# Patient Record
Sex: Female | Born: 1950
Health system: Southern US, Community
[De-identification: ages and names within clinical notes are randomized; demographics above are authoritative.]

## PROBLEM LIST (undated history)

## (undated) DIAGNOSIS — T7840XA Allergy, unspecified, initial encounter: Secondary | ICD-10-CM

## (undated) DIAGNOSIS — I2510 Atherosclerotic heart disease of native coronary artery without angina pectoris: Secondary | ICD-10-CM

## (undated) DIAGNOSIS — I10 Essential (primary) hypertension: Secondary | ICD-10-CM

## (undated) DIAGNOSIS — K219 Gastro-esophageal reflux disease without esophagitis: Secondary | ICD-10-CM

## (undated) DIAGNOSIS — E785 Hyperlipidemia, unspecified: Secondary | ICD-10-CM

## (undated) HISTORY — DX: Hyperlipidemia, unspecified: E78.5

## (undated) HISTORY — DX: Essential (primary) hypertension: I10

## (undated) HISTORY — DX: Gastro-esophageal reflux disease without esophagitis: K21.9

## (undated) HISTORY — PX: CHOLECYSTECTOMY: SHX55

## (undated) HISTORY — DX: Atherosclerotic heart disease of native coronary artery without angina pectoris: I25.10

## (undated) HISTORY — DX: Allergy, unspecified, initial encounter: T78.40XA

---

## 1991-10-18 HISTORY — PX: ABDOMINAL HYSTERECTOMY: SHX81

## 1991-10-18 HISTORY — PX: CHOLECYSTECTOMY, LAPAROSCOPIC: SHX56

## 1992-10-17 HISTORY — PX: BREAST BIOPSY: SHX20

## 1999-10-12 ENCOUNTER — Encounter: Payer: Self-pay | Admitting: Obstetrics and Gynecology

## 1999-10-12 ENCOUNTER — Encounter: Admission: RE | Admit: 1999-10-12 | Discharge: 1999-10-12 | Payer: Self-pay | Admitting: Obstetrics and Gynecology

## 2000-10-13 ENCOUNTER — Encounter: Admission: RE | Admit: 2000-10-13 | Discharge: 2000-10-13 | Payer: Self-pay | Admitting: Family Medicine

## 2000-10-13 ENCOUNTER — Encounter: Payer: Self-pay | Admitting: Family Medicine

## 2001-10-15 ENCOUNTER — Encounter: Payer: Self-pay | Admitting: Obstetrics and Gynecology

## 2001-10-15 ENCOUNTER — Encounter: Admission: RE | Admit: 2001-10-15 | Discharge: 2001-10-15 | Payer: Self-pay | Admitting: Obstetrics and Gynecology

## 2002-10-16 ENCOUNTER — Encounter: Payer: Self-pay | Admitting: Family Medicine

## 2002-10-16 ENCOUNTER — Encounter: Admission: RE | Admit: 2002-10-16 | Discharge: 2002-10-16 | Payer: Self-pay | Admitting: Family Medicine

## 2002-10-22 ENCOUNTER — Encounter: Payer: Self-pay | Admitting: Family Medicine

## 2002-10-22 ENCOUNTER — Encounter: Admission: RE | Admit: 2002-10-22 | Discharge: 2002-10-22 | Payer: Self-pay | Admitting: Family Medicine

## 2004-02-18 ENCOUNTER — Ambulatory Visit (HOSPITAL_COMMUNITY): Admission: RE | Admit: 2004-02-18 | Discharge: 2004-02-18 | Payer: Self-pay | Admitting: Obstetrics and Gynecology

## 2017-08-29 DIAGNOSIS — Z23 Encounter for immunization: Secondary | ICD-10-CM | POA: Diagnosis not present

## 2017-08-29 DIAGNOSIS — M545 Low back pain: Secondary | ICD-10-CM | POA: Diagnosis not present

## 2017-09-11 DIAGNOSIS — R739 Hyperglycemia, unspecified: Secondary | ICD-10-CM | POA: Diagnosis not present

## 2017-09-11 DIAGNOSIS — Z0000 Encounter for general adult medical examination without abnormal findings: Secondary | ICD-10-CM | POA: Diagnosis not present

## 2017-09-11 DIAGNOSIS — K219 Gastro-esophageal reflux disease without esophagitis: Secondary | ICD-10-CM | POA: Diagnosis not present

## 2017-09-12 DIAGNOSIS — R739 Hyperglycemia, unspecified: Secondary | ICD-10-CM | POA: Diagnosis not present

## 2017-09-12 DIAGNOSIS — J309 Allergic rhinitis, unspecified: Secondary | ICD-10-CM | POA: Diagnosis not present

## 2017-09-12 DIAGNOSIS — R94120 Abnormal auditory function study: Secondary | ICD-10-CM | POA: Diagnosis not present

## 2017-09-12 DIAGNOSIS — I10 Essential (primary) hypertension: Secondary | ICD-10-CM | POA: Diagnosis not present

## 2017-09-12 DIAGNOSIS — M7062 Trochanteric bursitis, left hip: Secondary | ICD-10-CM | POA: Diagnosis not present

## 2017-09-12 DIAGNOSIS — K210 Gastro-esophageal reflux disease with esophagitis: Secondary | ICD-10-CM | POA: Diagnosis not present

## 2017-09-12 DIAGNOSIS — Z0000 Encounter for general adult medical examination without abnormal findings: Secondary | ICD-10-CM | POA: Diagnosis not present

## 2017-09-19 DIAGNOSIS — H9072 Mixed conductive and sensorineural hearing loss, unilateral, left ear, with unrestricted hearing on the contralateral side: Secondary | ICD-10-CM | POA: Diagnosis not present

## 2017-10-26 DIAGNOSIS — H903 Sensorineural hearing loss, bilateral: Secondary | ICD-10-CM | POA: Diagnosis not present

## 2017-10-26 DIAGNOSIS — H608X9 Other otitis externa, unspecified ear: Secondary | ICD-10-CM | POA: Diagnosis not present

## 2017-10-26 DIAGNOSIS — H838X3 Other specified diseases of inner ear, bilateral: Secondary | ICD-10-CM | POA: Diagnosis not present

## 2017-10-26 DIAGNOSIS — H908 Mixed conductive and sensorineural hearing loss, unspecified: Secondary | ICD-10-CM | POA: Diagnosis not present

## 2017-10-26 DIAGNOSIS — H8092 Unspecified otosclerosis, left ear: Secondary | ICD-10-CM | POA: Diagnosis not present

## 2017-11-20 DIAGNOSIS — J40 Bronchitis, not specified as acute or chronic: Secondary | ICD-10-CM | POA: Diagnosis not present

## 2017-11-20 DIAGNOSIS — J069 Acute upper respiratory infection, unspecified: Secondary | ICD-10-CM | POA: Diagnosis not present

## 2018-02-14 DIAGNOSIS — Z1231 Encounter for screening mammogram for malignant neoplasm of breast: Secondary | ICD-10-CM | POA: Diagnosis not present

## 2018-03-13 DIAGNOSIS — R739 Hyperglycemia, unspecified: Secondary | ICD-10-CM | POA: Diagnosis not present

## 2018-03-13 DIAGNOSIS — I10 Essential (primary) hypertension: Secondary | ICD-10-CM | POA: Diagnosis not present

## 2018-03-13 DIAGNOSIS — E782 Mixed hyperlipidemia: Secondary | ICD-10-CM | POA: Diagnosis not present

## 2018-03-14 DIAGNOSIS — E785 Hyperlipidemia, unspecified: Secondary | ICD-10-CM | POA: Diagnosis not present

## 2018-03-14 DIAGNOSIS — R739 Hyperglycemia, unspecified: Secondary | ICD-10-CM | POA: Diagnosis not present

## 2018-03-20 DIAGNOSIS — Z1211 Encounter for screening for malignant neoplasm of colon: Secondary | ICD-10-CM | POA: Diagnosis not present

## 2018-03-20 DIAGNOSIS — K648 Other hemorrhoids: Secondary | ICD-10-CM | POA: Diagnosis not present

## 2018-03-20 DIAGNOSIS — Z8371 Family history of colonic polyps: Secondary | ICD-10-CM | POA: Diagnosis not present

## 2018-06-07 DIAGNOSIS — E782 Mixed hyperlipidemia: Secondary | ICD-10-CM | POA: Diagnosis not present

## 2018-06-19 DIAGNOSIS — E782 Mixed hyperlipidemia: Secondary | ICD-10-CM | POA: Diagnosis not present

## 2018-06-19 DIAGNOSIS — Z23 Encounter for immunization: Secondary | ICD-10-CM | POA: Diagnosis not present

## 2018-07-24 ENCOUNTER — Telehealth: Payer: Self-pay

## 2018-07-24 NOTE — Telephone Encounter (Signed)
My practice is too full unfortunately we can get her in with someone else in practice if she is willing and we can always revisit later if needed.

## 2018-07-24 NOTE — Telephone Encounter (Signed)
Copied from [facility] [phone]. Topic: Quick Communication - See Telephone Encounter >> Jul 24, 2018 11:25 AM Hewitt Shorts wrote: Pt is wanting to see if Dr. Charlett Blake will take her own as a new ppatient  Best number [phone]

## 2018-07-26 DIAGNOSIS — Z83511 Family history of glaucoma: Secondary | ICD-10-CM | POA: Diagnosis not present

## 2018-07-26 DIAGNOSIS — H40013 Open angle with borderline findings, low risk, bilateral: Secondary | ICD-10-CM | POA: Diagnosis not present

## 2018-07-26 DIAGNOSIS — H40053 Ocular hypertension, bilateral: Secondary | ICD-10-CM | POA: Diagnosis not present

## 2018-07-27 NOTE — Telephone Encounter (Signed)
Called pt on 07/25/2018 and asked her if I could call her back due to patients coming in office. Called her on 07/26/2018 and 07/27/2018. Left voicemail on [phone] for pt to call us back to set up appt with Dr. Nani Ravens. She states she would rather have a DO than a PA.

## 2018-08-20 ENCOUNTER — Encounter: Payer: Self-pay | Admitting: Family Medicine

## 2018-08-20 ENCOUNTER — Ambulatory Visit (INDEPENDENT_AMBULATORY_CARE_PROVIDER_SITE_OTHER): Payer: Medicare Other | Admitting: Family Medicine

## 2018-08-20 VITALS — BP 130/90 | HR 81 | Temp 98.0°F | Ht 62.0 in | Wt 142.2 lb

## 2018-08-20 DIAGNOSIS — J069 Acute upper respiratory infection, unspecified: Secondary | ICD-10-CM | POA: Diagnosis not present

## 2018-08-20 DIAGNOSIS — M7062 Trochanteric bursitis, left hip: Secondary | ICD-10-CM

## 2018-08-20 MED ORDER — CELECOXIB 200 MG PO CAPS: 200.0000 mg | ORAL_CAPSULE | Freq: Every day | ORAL | 2 refills | Status: DC | PRN

## 2018-08-20 NOTE — Patient Instructions (Signed)
Continue to push fluids, practice good hand hygiene, and cover your mouth if you cough.  If you start having fevers, shaking or shortness of breath, seek immediate care.  Stretches/exercises It is normal to feel mild stretching, pulling, tightness, or discomfort as you do these exercises, but you should stop right away if you feel sudden pain or your pain gets worse.  Stretching and range of motion exercises These exercises warm up your muscles and joints and improve the movement and flexibility of your hip and pelvis. Exercise A: Quadriceps, prone    1. Lie on your abdomen on a firm surface, such as a bed or padded floor. 2. Bend your left / right knee and hold your ankle. If you cannot reach your ankle or pant leg, loop a belt around your foot and grab the belt instead. 3. Gently pull your heel toward your buttocks. Your knee should not slide out to the side. You should feel a stretch in the front of your thigh and knee. 4. Hold this position for 30 seconds. Repeat 2 times. Complete this stretch 3 times per week. Exercise B: Iliotibial band    1. Lie on your side with your left / right leg in the top position. 2. Bend both of your knees and grab your left / right ankle. Stretch out your bottom arm to help you balance. 3. Slowly bring your top knee back so your thigh goes behind your trunk. 4. Slowly lower your top leg toward the floor until you feel a gentle stretch on the outside of your left / right hip and thigh. If you do not feel a stretch and your knee will not fall farther, place the heel of your other foot on top of your knee and pull your knee down toward the floor with your foot. 5. Hold this position for 30 seconds. Repeat 2 times. Complete this stretch 3 times per week. Strengthening exercises These exercises build strength and endurance in your hip and pelvis. Endurance is the ability to use your muscles for a long time, even after they get tired. Exercise C: Straight leg  raises (hip abductors)     1. Lie on your side with your left / right leg in the top position. Lie so your head, shoulder, knee, and hip line up. You may bend your bottom knee to help you balance. 2. Roll your hips slightly forward so your hips are stacked directly over each other and your left / right knee is facing forward. 3. Tense the muscles in your outer thigh and lift your top leg 4-6 inches (10-15 cm). 4. Hold this position for 3 seconds. Repeat for a total of 10 reps. 5. Slowly return to the starting position. Let your muscles relax completely before doing another repetition. Repeat 2 times. Complete this exercise 3 times per week. Exercise D: Straight leg raises (hip extensors) 1. Lie on your abdomen on your bed or a firm surface. You can put a pillow under your hips if that is more comfortable. 2. Bend your left / right knee so your foot is straight up in the air. 3. Squeeze your buttock muscles and lift your left / right thigh off the bed. Do not let your back arch. 4. Tense this muscle as hard as you can without increasing any knee pain. 5. Hold this position for 2 seconds. Repeat for a total of 10 reps 6. Slowly lower your leg to the starting position and allow it to relax completely. Repeat 2  times. Complete this exercise 3 times per week. Exercise E: Hip hike 1. Stand sideways on a bottom step. Stand on your left / right leg with your other foot unsupported next to the step. You can hold onto the railing or wall if needed for balance. 2. Keep your knees straight and your torso square. Then, lift your left / right hip up toward the ceiling. 3. Slowly let your left / right hip lower toward the floor, past the starting position. Your foot should get closer to the floor. Do not lean or bend your knees. Repeat 2 times. Complete this exercise 3 times per week.  Document Released: 10/03/2005 Document Revised: 06/07/2016 Document Reviewed: 09/04/2015 Elsevier Interactive Patient  Education  [employer].

## 2018-08-20 NOTE — Progress Notes (Signed)
Chief Complaint  Patient presents with  . New Patient (Initial Visit)       New Patient Visit SUBJECTIVE: HPI: Kelsey Stanley is an 67 y.o.female who is being seen for establishing care.   The patient was previously seen at [facility].  Duration: 1 week  Associated symptoms: sinus congestion, rhinorrhea and upper dental pain Denies: itchy watery eyes, ear pain, sinus, ear drainage, sore throat, wheezing, shortness of breath, myalgia and fevers Treatment to date: OTC cold medicine, throat lozenges, Tylenol  50% better today Sick contacts: No  Patient has a history of left-sided greater trochanteric bursitis.  She will take Celebrex intermittently for this issue and it usually helps.  She has never had an injection for this area because it is never bothered her enough to do so.  She has never seen physical therapy or had any home stretches or exercises for her IT band.  It is not bothering her significantly right now.  No numbness, tingling, or weakness.  Allergies  Allergen Reactions  . Morphine And Related Other (See Comments)    Unable to communicate  . Amoxicillin Rash  . Erythromycin Rash    Past Medical History:  Diagnosis Date  . GERD (gastroesophageal reflux disease)   . Hyperlipidemia   . Hypertension    Past Surgical History:  Procedure Laterality Date  . ABDOMINAL HYSTERECTOMY  1993  . BREAST BIOPSY Right 1994  . CHOLECYSTECTOMY, LAPAROSCOPIC  1993   Family History  Problem Relation Age of Onset  . Diabetes Mother   . Hyperlipidemia Mother   . Hypertension Mother   . Glaucoma Mother   . Cancer Mother        bladder cancer  . Heart disease Father   . Diabetes Sister   . Hypertension Sister   . Hyperlipidemia Brother   . Hypertension Brother   . Diabetes Sister   . Hypertension Sister    Allergies  Allergen Reactions  . Morphine And Related Other (See Comments)    Unable to communicate  . Amoxicillin Rash  . Erythromycin Rash    Current  Outpatient Medications:  .  celecoxib (CELEBREX) 200 MG capsule, Take 1 capsule (200 mg total) by mouth daily as needed for moderate pain., Disp: 30 capsule, Rfl: 2 .  cetirizine (ZYRTEC) 10 MG tablet, Take 10 mg by mouth daily., Disp: , Rfl:  .  cholecalciferol (VITAMIN D) 400 units TABS tablet, Take 400 Units by mouth., Disp: , Rfl:  .  Glucosamine-MSM-Hyaluronic Acd (JOINT HEALTH PO), Take by mouth., Disp: , Rfl:  .  metoprolol tartrate (LOPRESSOR) 50 MG tablet, Take 50 mg by mouth 2 (two) times daily., Disp: , Rfl:  .  pantoprazole (PROTONIX) 20 MG tablet, Take 20 mg by mouth daily., Disp: , Rfl:  .  pravastatin (PRAVACHOL) 40 MG tablet, Take 40 mg by mouth daily., Disp: , Rfl:  .  Probiotic Product (PROBIOTIC-10 PO), Take by mouth., Disp: , Rfl:   ROS Musculoskeletal: Denies current hip pain  Respiratory: Denies dyspnea   OBJECTIVE: BP 130/90 (BP Location: Left Arm, Patient Position: Sitting, Cuff Size: Normal)   Pulse 81   Temp 98 F (36.7 C) (Oral)   Ht 5\' 2"  (1.575 m)   Wt 142 lb 4 oz (64.5 kg)   SpO2 95%   BMI 26.02 kg/m   Constitutional: -  VS reviewed -  Well developed, well nourished, appears stated age -  No apparent distress  Psychiatric: -  Oriented to person, place, and time -  Memory intact -  Affect and mood normal -  Fluent conversation, good eye contact -  Judgment and insight age appropriate  Eye: -  Conjunctivae clear, no discharge -  Pupils symmetric, round, reactive to light  ENMT: -  Ears neg -  Nares patent, no d/c -  No sinus ttp -  MMM    Pharynx moist, no exudate, no erythema  Neck: -  No gross swelling, no palpable masses -  Thyroid midline, not enlarged, mobile, no palpable masses  Cardiovascular: -  RRR -  No LE edema  Respiratory: -  Normal respiratory effort, no accessory muscle use, no retraction -  Breath sounds equal, no wheezes, no ronchi, no crackles  Musculoskeletal: -  No clubbing, no cyanosis -  Gait normal -  +TTP over greater  trochanteric bursa on the left  Skin: -  No significant lesion on inspection -  Warm and dry to palpation   ASSESSMENT/PLAN: Upper respiratory tract infection, unspecified type  Greater trochanteric bursitis of left hip  I am able to see her records through care everywhere. Supportive care for respiratory infection.  This sounds like it is improving. I will continue Celebrex, intermittent use, for her bursitis.  I also gave her some stretches/exercises for her IT band.  She will let me know if it is bothersome enough to get an injection. Patient should return in 6 weeks for her annual wellness visit and physical. The patient voiced understanding and agreement to the plan.   [place], DO 08/20/18  12:04 PM

## 2018-08-20 NOTE — Progress Notes (Signed)
Pre visit review using our clinic review tool, if applicable. No additional management support is needed unless otherwise documented below in the visit note. 

## 2018-09-06 ENCOUNTER — Encounter: Payer: Self-pay | Admitting: Family Medicine

## 2018-09-06 MED ORDER — PRAVASTATIN SODIUM 40 MG PO TABS: 40.0000 mg | ORAL_TABLET | Freq: Every day | ORAL | 5 refills | Status: DC

## 2018-10-01 NOTE — Progress Notes (Signed)
Subjective:   Kelsey Stanley is a 67 y.o. female who presents for an Initial Medicare Annual Wellness Visit.  Checks on parents a couple times per week and cares for them.  Review of Systems   No ROS.  Medicare Wellness Visit. Additional risk factors are reflected in the social history. Cardiac Risk Factors include: advanced age (>8men, >72 women) Sleep patterns: Sleeps 8-9 hrs. No issues.  Home Safety/Smoke Alarms: Feels safe in home. Smoke alarms in place. Lives in one story home. Walk in shower w/ bench.   Female:   Pap- declines. Hx hysterectomy      Mammo- 02/14/18 per care everywhere       Dexa scan-   ordered     [facility]- pt reports last 06/2018 [facility]. Normal. Record request from.  Eye- Dr. Alois Cliche every 6 months. Wearing glasses.    Objective:    Today's Vitals   10/02/18 0938  BP: 122/84  Pulse: 87  SpO2: 94%  Weight: 144 lb (65.3 kg)  Height: 5\' 2"  (1.575 m)   Body mass index is 26.34 kg/m.  Advanced Directives 10/02/2018  Does Patient Have a Medical Advance Directive? No  Would patient like information on creating a medical advance directive? No - Patient declined    Current Medications (verified) Outpatient Encounter Medications as of 10/02/2018  Medication Sig  . celecoxib (CELEBREX) 200 MG capsule Take 1 capsule (200 mg total) by mouth daily as needed for moderate pain.  . cetirizine (ZYRTEC) 10 MG tablet Take 10 mg by mouth daily.  . cholecalciferol (VITAMIN D) 400 units TABS tablet Take 400 Units by mouth.  . Glucosamine-MSM-Hyaluronic Acd (JOINT HEALTH PO) Take by mouth.  . metoprolol succinate (TOPROL-XL) 50 MG 24 hr tablet Take 1 tablet (50 mg total) by mouth daily. Take with or immediately following a meal.  . pantoprazole (PROTONIX) 20 MG tablet Take 1 tablet (20 mg total) by mouth daily.  . pravastatin (PRAVACHOL) 40 MG tablet Take 1 tablet (40 mg total) by mouth daily.  . Probiotic Product (PROBIOTIC-10 PO) Take by mouth.  .  [DISCONTINUED] metoprolol tartrate (LOPRESSOR) 50 MG tablet Take 50 mg by mouth daily.   . [DISCONTINUED] pantoprazole (PROTONIX) 20 MG tablet Take 20 mg by mouth daily.   No facility-administered encounter medications on file as of 10/02/2018.     Allergies (verified) Morphine and related; Amoxicillin; and Erythromycin   History: Past Medical History:  Diagnosis Date  . GERD (gastroesophageal reflux disease)   . Hyperlipidemia   . Hypertension    Past Surgical History:  Procedure Laterality Date  . ABDOMINAL HYSTERECTOMY  1993  . BREAST BIOPSY Right 1994  . CHOLECYSTECTOMY, LAPAROSCOPIC  1993   Family History  Problem Relation Age of Onset  . Diabetes Mother   . Hyperlipidemia Mother   . Hypertension Mother   . Glaucoma Mother   . Cancer Mother        bladder cancer  . Heart disease Father   . Diabetes Sister   . Hypertension Sister   . Hyperlipidemia Brother   . Hypertension Brother   . Diabetes Sister   . Hypertension Sister    Social History   Socioeconomic History  . Marital status: Married    Spouse name: Not on file  . Number of children: Not on file  . Years of education: Not on file  . Highest education level: Not on file  Occupational History  . Not on file  Social Needs  .  Financial resource strain: Not on file  . Food insecurity:    Worry: Not on file    Inability: Not on file  . Transportation needs:    Medical: Not on file    Non-medical: Not on file  Tobacco Use  . Smoking status: Never Smoker  . Smokeless tobacco: Never Used  Substance and Sexual Activity  . Alcohol use: Never    Frequency: Never  . Drug use: Never  . Sexual activity: Yes  Lifestyle  . Physical activity:    Days per week: Not on file    Minutes per session: Not on file  . Stress: Not on file  Relationships  . Social connections:    Talks on phone: Not on file    Gets together: Not on file    Attends religious service: Not on file    Active member of club or  organization: Not on file    Attends meetings of clubs or organizations: Not on file    Relationship status: Not on file  Other Topics Concern  . Not on file  Social History Narrative  . Not on file    Tobacco Counseling Counseling given: Not Answered   Clinical Intake:     Pain : No/denies pain                  Activities of Daily Living In your present state of health, do you have any difficulty performing the following activities: 10/02/2018  Hearing? N  Vision? N  Difficulty concentrating or making decisions? N  Walking or climbing stairs? N  Dressing or bathing? N  Doing errands, shopping? N  Preparing Food and eating ? N  Using the Toilet? N  In the past six months, have you accidently leaked urine? N  Do you have problems with loss of bowel control? N  Managing your Medications? N  Managing your Finances? N  Housekeeping or managing your Housekeeping? N  Some recent data might be hidden     Immunizations and Health Maintenance Immunization History  Administered Date(s) Administered  . Influenza-Unspecified 08/09/2016, 06/19/2018  . Pneumococcal Conjugate-13 06/19/2018  . Tdap 10/02/2018   Health Maintenance Due  Topic Date Due  . Hepatitis C Screening  Aug 03, 1951  . TETANUS/TDAP  09/16/1971  . DEXA SCAN  09/15/2017    Patient Care Team: Shelda Pal, DO as PCP - General (Family Medicine)  Indicate any recent Medical Services you may have received from other than [facility] providers in the past year (date may be approximate).     Assessment:   This is a routine wellness examination for [place]. Physical assessment deferred to PCP.  Hearing/Vision screen  Visual Acuity Screening   Right eye Left eye Both eyes  Without correction:     With correction: 20/20 20/20 20/20   Hearing Screening Comments: Able to hear conversational tones w/o difficulty. No issues reported.    Dietary issues and exercise activities discussed: Current  Exercise Habits: The patient does not participate in regular exercise at present, Exercise limited by: None identified Diet (meal preparation, eat out, water intake, caffeinated beverages, dairy products, fruits and vegetables): well balanced, on average, 2-3 meals per day. Pt reports drinking plenty of water.   Goals    . Increase physical activity     Join new gym      Depression Screen PHQ 2/9 Scores 10/02/2018  PHQ - 2 Score 0    Fall Risk Fall Risk  10/02/2018  Falls in the past  year? 0     Cognitive Function: Ad8 score reviewed for issues:  Issues making decisions:no  Less interest in hobbies / activities:no  Repeats questions, stories (family complaining):no  Trouble using ordinary gadgets (microwave, computer, phone):no  Forgets the month or year: no  Mismanaging finances: no  Remembering appts:no  Daily problems with thinking and/or memory:no Ad8 score is=0         Screening Tests Health Maintenance  Topic Date Due  . Hepatitis C Screening  Sep 13, 1951  . TETANUS/TDAP  09/16/1971  . DEXA SCAN  09/15/2017  . PNA vac Low Risk Adult (2 of 2 - PPSV23) 03/18/2019  . MAMMOGRAM  01/16/2020  . COLONOSCOPY  06/17/2028  . INFLUENZA VACCINE  Completed       Plan:    Please schedule your next medicare wellness visit with me in 1 yr.  Continue to eat heart healthy diet (full of fruits, vegetables, whole grains, lean protein, water--limit salt, fat, and sugar intake) and increase physical activity as tolerated.  Continue doing brain stimulating activities (puzzles, reading, adult coloring books, staying active) to keep memory sharp.   Please schedule bone density scan.  I have personally reviewed and noted the following in the patient's chart:   . Medical and social history . Use of alcohol, tobacco or illicit drugs  . Current medications and supplements . Functional ability and status . Nutritional status . Physical activity . Advanced  directives . List of other physicians . Hospitalizations, surgeries, and ER visits in previous 12 months . Vitals . Screenings to include cognitive, depression, and falls . Referrals and appointments  In addition, I have reviewed and discussed with patient certain preventive protocols, quality metrics, and best practice recommendations. A written personalized care plan for preventive services as well as general preventive health recommendations were provided to patient.     Shela Nevin, [place]   10/02/2018

## 2018-10-02 ENCOUNTER — Ambulatory Visit (INDEPENDENT_AMBULATORY_CARE_PROVIDER_SITE_OTHER): Payer: Medicare Other | Admitting: *Deleted

## 2018-10-02 ENCOUNTER — Encounter: Payer: Self-pay | Admitting: *Deleted

## 2018-10-02 ENCOUNTER — Encounter: Payer: Self-pay | Admitting: Family Medicine

## 2018-10-02 ENCOUNTER — Ambulatory Visit (INDEPENDENT_AMBULATORY_CARE_PROVIDER_SITE_OTHER): Payer: Medicare Other | Admitting: Family Medicine

## 2018-10-02 VITALS — BP 122/84 | HR 87 | Ht 62.0 in | Wt 144.0 lb

## 2018-10-02 VITALS — BP 122/84 | HR 87 | Temp 97.7°F | Ht 62.0 in | Wt 144.0 lb

## 2018-10-02 DIAGNOSIS — Z0000 Encounter for general adult medical examination without abnormal findings: Secondary | ICD-10-CM

## 2018-10-02 DIAGNOSIS — Z23 Encounter for immunization: Secondary | ICD-10-CM | POA: Diagnosis not present

## 2018-10-02 DIAGNOSIS — E2839 Other primary ovarian failure: Secondary | ICD-10-CM

## 2018-10-02 DIAGNOSIS — K219 Gastro-esophageal reflux disease without esophagitis: Secondary | ICD-10-CM | POA: Insufficient documentation

## 2018-10-02 DIAGNOSIS — I10 Essential (primary) hypertension: Secondary | ICD-10-CM | POA: Diagnosis not present

## 2018-10-02 DIAGNOSIS — E785 Hyperlipidemia, unspecified: Secondary | ICD-10-CM | POA: Diagnosis not present

## 2018-10-02 LAB — LIPID PANEL
Cholesterol: 165 mg/dL (ref 0–200)
HDL: 62.1 mg/dL (ref >39.00)
LDL CALC: 90 mg/dL (ref 0–99)
NONHDL: 102.65
Total CHOL/HDL Ratio: 3
Triglycerides: 61 mg/dL (ref 0.0–149.0)
VLDL: 12.2 mg/dL (ref 0.0–40.0)

## 2018-10-02 LAB — COMPREHENSIVE METABOLIC PANEL
ALBUMIN: 4.3 g/dL (ref 3.5–5.2)
ALK PHOS: 85 U/L (ref 39–117)
ALT: 18 U/L (ref 0–35)
AST: 17 U/L (ref 0–37)
BUN: 16 mg/dL (ref 6–23)
CO2: 27 mEq/L (ref 19–32)
Calcium: 9.1 mg/dL (ref 8.4–10.5)
Chloride: 107 mEq/L (ref 96–112)
Creatinine, Ser: 0.73 mg/dL (ref 0.40–1.20)
GFR: 84.76 mL/min (ref >60.00)
Glucose, Bld: 102 mg/dL — ABNORMAL HIGH (ref 70–99)
POTASSIUM: 4.2 meq/L (ref 3.5–5.1)
SODIUM: 141 meq/L (ref 135–145)
TOTAL PROTEIN: 6.7 g/dL (ref 6.0–8.3)
Total Bilirubin: 0.3 mg/dL (ref 0.2–1.2)

## 2018-10-02 MED ORDER — METOPROLOL SUCCINATE ER 50 MG PO TB24: 50.0000 mg | ORAL_TABLET | Freq: Every day | ORAL | 3 refills | Status: DC

## 2018-10-02 MED ORDER — PANTOPRAZOLE SODIUM 20 MG PO TBEC: 20.0000 mg | DELAYED_RELEASE_TABLET | Freq: Every day | ORAL | 3 refills | Status: DC

## 2018-10-02 NOTE — Patient Instructions (Addendum)
Keep the diet clean and stay active.  Give [place] 2-3 business days to get the results of your labs back.   The new Shingrix vaccine (for shingles) is a 2 shot series. It can make people feel low energy, achy and almost like they have the flu for 48 hours after injection. Please plan accordingly when deciding on when to get this shot. Call our office for a nurse visit appointment to get this. The second shot of the series is less severe regarding the side effects, but it still lasts 48 hours.   Aim to do some physical exertion for 150 minutes per week. This is typically divided into 5 days per week, 30 minutes per day. The activity should be enough to get your heart rate up. Anything is better than nothing if you have time constraints.  Let us know if you need anything.

## 2018-10-02 NOTE — Addendum Note (Signed)
Addended by: Sharon Seller B on: 10/02/2018 10:05 AM   Modules accepted: Orders

## 2018-10-02 NOTE — Patient Instructions (Signed)
Please schedule your next medicare wellness visit with me in 1 yr.  Continue to eat heart healthy diet (full of fruits, vegetables, whole grains, lean protein, water--limit salt, fat, and sugar intake) and increase physical activity as tolerated.  Continue doing brain stimulating activities (puzzles, reading, adult coloring books, staying active) to keep memory sharp.   Please schedule bone density scan.   Kelsey Stanley , Thank you for taking time to come for your Medicare Wellness Visit. I appreciate your ongoing commitment to your health goals. Please review the following plan we discussed and let me know if I can assist you in the future.   These are the goals we discussed: Goals    . Increase physical activity     Join new gym       This is a list of the screening recommended for you and due dates:  Health Maintenance  Topic Date Due  .  Hepatitis C: One time screening is recommended by Center for Disease Control  (CDC) for  adults born from [birth year] through 1964.   28-Jul-1951  . Tetanus Vaccine  09/16/1971  . DEXA scan (bone density measurement)  09/15/2017  . Pneumonia vaccines (2 of 2 - PPSV23) 03/18/2019  . Mammogram  01/16/2020  . Colon Cancer Screening  06/17/2028  . Flu Shot  Completed    Health Maintenance for Postmenopausal Women Menopause is a normal process in which your reproductive ability comes to an end. This process happens gradually over a span of months to years, usually between the ages of 100 and 38. Menopause is complete when you have missed 12 consecutive menstrual periods. It is important to talk with your health care provider about some of the most common conditions that affect postmenopausal women, such as heart disease, cancer, and bone loss (osteoporosis). Adopting a healthy lifestyle and getting preventive care can help to promote your health and wellness. Those actions can also lower your chances of developing some of these common conditions. What should I  know about menopause? During menopause, you may experience a number of symptoms, such as:  Moderate-to-severe hot flashes.  Night sweats.  Decrease in sex drive.  Mood swings.  Headaches.  Tiredness.  Irritability.  Memory problems.  Insomnia.  Choosing to treat or not to treat menopausal changes is an individual decision that you make with your health care provider. What should I know about hormone replacement therapy and supplements? Hormone therapy products are effective for treating symptoms that are associated with menopause, such as hot flashes and night sweats. Hormone replacement carries certain risks, especially as you become older. If you are thinking about using estrogen or estrogen with progestin treatments, discuss the benefits and risks with your health care provider. What should I know about heart disease and stroke? Heart disease, heart attack, and stroke become more likely as you age. This may be due, in part, to the hormonal changes that your body experiences during menopause. These can affect how your body processes dietary fats, triglycerides, and cholesterol. Heart attack and stroke are both medical emergencies. There are many things that you can do to help prevent heart disease and stroke:  Have your blood pressure checked at least every 1-2 years. High blood pressure causes heart disease and increases the risk of stroke.  If you are 47-70 years old, ask your health care provider if you should take aspirin to prevent a heart attack or a stroke.  Do not use any tobacco products, including cigarettes, chewing tobacco, or  electronic cigarettes. If you need help quitting, ask your health care provider.  It is important to eat a healthy diet and maintain a healthy weight. ? Be sure to include plenty of vegetables, fruits, low-fat dairy products, and lean protein. ? Avoid eating foods that are high in solid fats, added sugars, or salt (sodium).  Get regular  exercise. This is one of the most important things that you can do for your health. ? Try to exercise for at least 150 minutes each week. The type of exercise that you do should increase your heart rate and make you sweat. This is known as moderate-intensity exercise. ? Try to do strengthening exercises at least twice each week. Do these in addition to the moderate-intensity exercise.  Know your numbers.Ask your health care provider to check your cholesterol and your blood glucose. Continue to have your blood tested as directed by your health care provider.  What should I know about cancer screening? There are several types of cancer. Take the following steps to reduce your risk and to catch any cancer development as early as possible. Breast Cancer  Practice breast self-awareness. ? This means understanding how your breasts normally appear and feel. ? It also means doing regular breast self-exams. Let your health care provider know about any changes, no matter how small.  If you are 14 or older, have a clinician do a breast exam (clinical breast exam or CBE) every year. Depending on your age, family history, and medical history, it may be recommended that you also have a yearly breast X-ray (mammogram).  If you have a family history of breast cancer, talk with your health care provider about genetic screening.  If you are at high risk for breast cancer, talk with your health care provider about having an MRI and a mammogram every year.  Breast cancer (BRCA) gene test is recommended for women who have family members with BRCA-related cancers. Results of the assessment will determine the need for genetic counseling and BRCA1 and for BRCA2 testing. BRCA-related cancers include these types: ? Breast. This occurs in males or females. ? Ovarian. ? Tubal. This may also be called fallopian tube cancer. ? Cancer of the abdominal or pelvic lining (peritoneal  cancer). ? Prostate. ? Pancreatic.  Cervical, Uterine, and Ovarian Cancer Your health care provider may recommend that you be screened regularly for cancer of the pelvic organs. These include your ovaries, uterus, and vagina. This screening involves a pelvic exam, which includes checking for microscopic changes to the surface of your cervix (Pap test).  For women ages 21-65, health care providers may recommend a pelvic exam and a Pap test every three years. For women ages 58-65, they may recommend the Pap test and pelvic exam, combined with testing for human papilloma virus (HPV), every five years. Some types of HPV increase your risk of cervical cancer. Testing for HPV may also be done on women of any age who have unclear Pap test results.  Other health care providers may not recommend any screening for nonpregnant women who are considered low risk for pelvic cancer and have no symptoms. Ask your health care provider if a screening pelvic exam is right for you.  If you have had past treatment for cervical cancer or a condition that could lead to cancer, you need Pap tests and screening for cancer for at least 20 years after your treatment. If Pap tests have been discontinued for you, your risk factors (such as having a new sexual  partner) need to be reassessed to determine if you should start having screenings again. Some women have medical problems that increase the chance of getting cervical cancer. In these cases, your health care provider may recommend that you have screening and Pap tests more often.  If you have a family history of uterine cancer or ovarian cancer, talk with your health care provider about genetic screening.  If you have vaginal bleeding after reaching menopause, tell your health care provider.  There are currently no reliable tests available to screen for ovarian cancer.  Lung Cancer Lung cancer screening is recommended for adults 1-3 years old who are at high risk for  lung cancer because of a history of smoking. A yearly low-dose CT scan of the lungs is recommended if you:  Currently smoke.  Have a history of at least 30 pack-years of smoking and you currently smoke or have quit within the past 15 years. A pack-year is smoking an average of one pack of cigarettes per day for one year.  Yearly screening should:  Continue until it has been 15 years since you quit.  Stop if you develop a health problem that would prevent you from having lung cancer treatment.  Colorectal Cancer  This type of cancer can be detected and can often be prevented.  Routine colorectal cancer screening usually begins at age 30 and continues through age 62.  If you have risk factors for colon cancer, your health care provider may recommend that you be screened at an earlier age.  If you have a family history of colorectal cancer, talk with your health care provider about genetic screening.  Your health care provider may also recommend using home test kits to check for hidden blood in your stool.  A small camera at the end of a tube can be used to examine your colon directly (sigmoidoscopy or colonoscopy). This is done to check for the earliest forms of colorectal cancer.  Direct examination of the colon should be repeated every 5-10 years until age 15. However, if early forms of precancerous polyps or small growths are found or if you have a family history or genetic risk for colorectal cancer, you may need to be screened more often.  Skin Cancer  Check your skin from head to toe regularly.  Monitor any moles. Be sure to tell your health care provider: ? About any new moles or changes in moles, especially if there is a change in a mole's shape or color. ? If you have a mole that is larger than the size of a pencil eraser.  If any of your family members has a history of skin cancer, especially at a young age, talk with your health care provider about genetic  screening.  Always use sunscreen. Apply sunscreen liberally and repeatedly throughout the day.  Whenever you are outside, protect yourself by wearing long sleeves, pants, a wide-brimmed hat, and sunglasses.  What should I know about osteoporosis? Osteoporosis is a condition in which bone destruction happens more quickly than new bone creation. After menopause, you may be at an increased risk for osteoporosis. To help prevent osteoporosis or the bone fractures that can happen because of osteoporosis, the following is recommended:  If you are 67-13 years old, get at least 1,000 mg of calcium and at least 600 mg of vitamin D per day.  If you are older than age 71 but younger than age 66, get at least 1,200 mg of calcium and at least 600 mg  of vitamin D per day.  If you are older than age 46, get at least 1,200 mg of calcium and at least 800 mg of vitamin D per day.  Smoking and excessive alcohol intake increase the risk of osteoporosis. Eat foods that are rich in calcium and vitamin D, and do weight-bearing exercises several times each week as directed by your health care provider. What should I know about how menopause affects my mental health? Depression may occur at any age, but it is more common as you become older. Common symptoms of depression include:  Low or sad mood.  Changes in sleep patterns.  Changes in appetite or eating patterns.  Feeling an overall lack of motivation or enjoyment of activities that you previously enjoyed.  Frequent crying spells.  Talk with your health care provider if you think that you are experiencing depression. What should I know about immunizations? It is important that you get and maintain your immunizations. These include:  Tetanus, diphtheria, and pertussis (Tdap) booster vaccine.  Influenza every year before the flu season begins.  Pneumonia vaccine.  Shingles vaccine.  Your health care provider may also recommend other  immunizations. This information is not intended to replace advice given to you by your health care provider. Make sure you discuss any questions you have with your health care provider. Document Released: 11/25/2005 Document Revised: 04/22/2016 Document Reviewed: 07/07/2015 Elsevier Interactive Patient Education  2018 [employer].

## 2018-10-02 NOTE — Progress Notes (Signed)
Chief Complaint  Patient presents with  . Follow-up  . Annual Exam    Subjective Kelsey Stanley is a 67 y.o. female who presents for hypertension follow up. She does monitor home blood pressures. Blood pressures ranging from 110-120's/70's on average. She is compliant with medications. Patient has these side effects of medication: none She is adhering to a healthy diet overall. Current exercise: nothing  GERD Does well on Protonix 20 mg/d. She tried to come off of it in the past and did not do well. No current s/s's.   Hyperlipidemia Patient presents for dyslipidemia follow up. Currently being treated with pravastatin 40 mg/d and compliance with treatment thus far has been good. She denies myalgias. She is adhering to a healthy. Exercise: None The patient is not known to have coexisting coronary artery disease.   Past Medical History:  Diagnosis Date  . GERD (gastroesophageal reflux disease)   . Hyperlipidemia   . Hypertension     Review of Systems Cardiovascular: no chest pain Respiratory:  no shortness of breath  Exam BP 122/84 (BP Location: Left Arm, Patient Position: Sitting, Cuff Size: Normal)   Pulse 87   Temp 97.7 F (36.5 C) (Oral)   Ht 5\' 2"  (1.575 m)   Wt 144 lb (65.3 kg)   SpO2 94%   BMI 26.34 kg/m  General:  well developed, well nourished, in no apparent distress Heart: RRR, no bruits, no LE edema Lungs: clear to auscultation, no accessory muscle use Psych: well oriented with normal range of affect and appropriate judgment/insight  Gastroesophageal reflux disease, esophagitis presence not specified  Essential hypertension - Plan: Comprehensive metabolic panel  Hyperlipidemia, unspecified hyperlipidemia type - Plan: Comprehensive metabolic panel, Lipid panel  Estrogen deficiency - Plan: DG Bone Density  Orders as above. Tdap today. Other HM updated. Counseled on diet and exercise. F/u in 6 mo. Will get PCV23.  The patient voiced  understanding and agreement to the plan.  [place], DO 10/02/18  9:57 AM

## 2018-10-02 NOTE — Progress Notes (Signed)
Pre visit review using our clinic review tool, if applicable. No additional management support is needed unless otherwise documented below in the visit note. 

## 2018-10-02 NOTE — Progress Notes (Signed)
Noted. Agree with above.  [place], DO 10/02/18 12:27 PM

## 2018-10-04 ENCOUNTER — Ambulatory Visit (HOSPITAL_BASED_OUTPATIENT_CLINIC_OR_DEPARTMENT_OTHER)
Admission: RE | Admit: 2018-10-04 | Discharge: 2018-10-04 | Disposition: A | Discharge status: Home / Self Care | Payer: Medicare Other | Source: Ambulatory Visit | Attending: Family Medicine | Admitting: Family Medicine

## 2018-10-04 DIAGNOSIS — E2839 Other primary ovarian failure: Secondary | ICD-10-CM | POA: Diagnosis not present

## 2018-10-04 DIAGNOSIS — Z780 Asymptomatic menopausal state: Secondary | ICD-10-CM | POA: Diagnosis not present

## 2018-10-04 DIAGNOSIS — Z13820 Encounter for screening for osteoporosis: Secondary | ICD-10-CM | POA: Diagnosis not present

## 2018-11-28 ENCOUNTER — Other Ambulatory Visit: Payer: Self-pay | Admitting: Family Medicine

## 2018-11-28 MED ORDER — PRAVASTATIN SODIUM 40 MG PO TABS: 40.0000 mg | ORAL_TABLET | Freq: Every day | ORAL | 0 refills | Status: DC

## 2018-12-11 ENCOUNTER — Encounter: Payer: Self-pay | Admitting: Family Medicine

## 2018-12-12 ENCOUNTER — Encounter: Payer: Self-pay | Admitting: Family Medicine

## 2018-12-12 ENCOUNTER — Other Ambulatory Visit: Payer: Self-pay | Admitting: Family Medicine

## 2018-12-12 MED ORDER — FLUOCINOLONE ACETONIDE 0.01 % OT OIL: 1.0000 [drp] | TOPICAL_OIL | Freq: Every day | OTIC | 0 refills | Status: DC

## 2019-02-21 ENCOUNTER — Encounter: Payer: Self-pay | Admitting: Family Medicine

## 2019-02-22 MED ORDER — PRAVASTATIN SODIUM 40 MG PO TABS: 40.0000 mg | ORAL_TABLET | Freq: Every day | ORAL | 0 refills | Status: DC

## 2019-03-19 DIAGNOSIS — H40013 Open angle with borderline findings, low risk, bilateral: Secondary | ICD-10-CM | POA: Diagnosis not present

## 2019-03-19 DIAGNOSIS — H524 Presbyopia: Secondary | ICD-10-CM | POA: Diagnosis not present

## 2019-03-20 DIAGNOSIS — Z1231 Encounter for screening mammogram for malignant neoplasm of breast: Secondary | ICD-10-CM | POA: Diagnosis not present

## 2019-03-26 ENCOUNTER — Other Ambulatory Visit: Payer: Self-pay | Admitting: Family Medicine

## 2019-04-03 ENCOUNTER — Ambulatory Visit (INDEPENDENT_AMBULATORY_CARE_PROVIDER_SITE_OTHER): Payer: Medicare Other | Admitting: Family Medicine

## 2019-04-03 ENCOUNTER — Encounter: Payer: Self-pay | Admitting: Family Medicine

## 2019-04-03 ENCOUNTER — Other Ambulatory Visit: Payer: Self-pay

## 2019-04-03 VITALS — BP 144/78 | HR 78 | Temp 97.8°F | Ht 61.5 in | Wt 143.2 lb

## 2019-04-03 DIAGNOSIS — K219 Gastro-esophageal reflux disease without esophagitis: Secondary | ICD-10-CM | POA: Diagnosis not present

## 2019-04-03 DIAGNOSIS — Z1159 Encounter for screening for other viral diseases: Secondary | ICD-10-CM

## 2019-04-03 DIAGNOSIS — I10 Essential (primary) hypertension: Secondary | ICD-10-CM | POA: Diagnosis not present

## 2019-04-03 DIAGNOSIS — E785 Hyperlipidemia, unspecified: Secondary | ICD-10-CM | POA: Diagnosis not present

## 2019-04-03 LAB — COMPREHENSIVE METABOLIC PANEL
ALT: 18 U/L (ref 0–35)
AST: 16 U/L (ref 0–37)
Albumin: 4.5 g/dL (ref 3.5–5.2)
Alkaline Phosphatase: 81 U/L (ref 39–117)
BUN: 16 mg/dL (ref 6–23)
CO2: 29 mEq/L (ref 19–32)
Calcium: 9.8 mg/dL (ref 8.4–10.5)
Chloride: 105 mEq/L (ref 96–112)
Creatinine, Ser: 0.81 mg/dL (ref 0.40–1.20)
GFR: 70.62 mL/min (ref >60.00)
Glucose, Bld: 100 mg/dL — ABNORMAL HIGH (ref 70–99)
Potassium: 4.5 mEq/L (ref 3.5–5.1)
Sodium: 141 mEq/L (ref 135–145)
Total Bilirubin: 0.4 mg/dL (ref 0.2–1.2)
Total Protein: 6.9 g/dL (ref 6.0–8.3)

## 2019-04-03 LAB — LIPID PANEL
Cholesterol: 186 mg/dL (ref 0–200)
HDL: 55.6 mg/dL (ref >39.00)
LDL Cholesterol: 103 mg/dL — ABNORMAL HIGH (ref 0–99)
NonHDL: 130.06
Total CHOL/HDL Ratio: 3
Triglycerides: 135 mg/dL (ref 0.0–149.0)
VLDL: 27 mg/dL (ref 0.0–40.0)

## 2019-04-03 NOTE — Patient Instructions (Addendum)
Keep the diet clean and stay active.  Give us 2-3 business days to get the results of your labs back.   Because your blood pressure is well-controlled, you no longer have to check your blood pressure at home anymore unless you wish. Some people check it twice daily every day and some people stop altogether. Either or anything in between is fine. Strong work!  Let us know if you need anything.  

## 2019-04-03 NOTE — Progress Notes (Signed)
Chief Complaint  Patient presents with  . Follow-up    Subjective: Hyperlipidemia Patient presents for Hyperlipidemia follow up. Currently taking pravastatin 40 mg/d and compliance with treatment thus far has been good. She denies myalgias. She is generallyadhering to a healthy diet. Exercise: walking, yard work The patient is not known to have coexisting coronary artery disease.  GERD- Protonix 20 mg/d, no AE's. Tried coming off in past, did not do well. Tolerating well, denies wt loss, vomiting, bleeding.   Marland KitchenHypertension Patient presents for hypertension follow up. She does monitor home blood pressures. Blood pressures ranging on average from 110-130's/70's. She is compliant with medication- Toprol XL 50 mg/d. Patient has these side effects of medication: none She is usually adhering to a healthy diet overall. Exercise: walking, stay active   ROS: Heart: Denies chest pain or palpitations Lungs: Denies SOB or cough  Past Medical History:  Diagnosis Date  . GERD (gastroesophageal reflux disease)   . Hyperlipidemia   . Hypertension     Objective: BP (!) 144/78 (BP Location: Left Arm, Patient Position: Sitting, Cuff Size: Normal)   Pulse 78   Temp 97.8 F (36.6 C) (Oral)   Ht 5' 1.5" (1.562 m)   Wt 143 lb 4 oz (65 kg)   SpO2 93%   BMI 26.63 kg/m  General: Awake, appears stated age HEENT: MMM Heart: RRR, no LE edema, no bruits Lungs: CTAB, no rales, wheezes or rhonchi. No accessory muscle use Psych: Age appropriate judgment and insight, normal affect and mood  Assessment and Plan: Essential hypertension - Plan: Comprehensive metabolic panel; cont betablocker.   Hyperlipidemia, unspecified hyperlipidemia type - Plan: Comprehensive metabolic panel, Lipid panel  Gastroesophageal reflux disease, esophagitis presence not specified   Encounter for hepatitis C screening test for low risk patient - Plan: Hepatitis C antibod  Orders as above. F/u in 6 mo for AWV and  med ck. The patient voiced understanding and agreement to the plan.  [place], DO 04/03/19  10:17 AM

## 2019-04-04 LAB — HEPATITIS C ANTIBODY
Hepatitis C Ab: NONREACTIVE
SIGNAL TO CUT-OFF: 0.01 (ref <1.00)

## 2019-06-01 ENCOUNTER — Encounter: Payer: Self-pay | Admitting: Family Medicine

## 2019-06-03 MED ORDER — PRAVASTATIN SODIUM 40 MG PO TABS: 40.0000 mg | ORAL_TABLET | Freq: Every day | ORAL | 1 refills | Status: DC

## 2019-07-29 DIAGNOSIS — Z23 Encounter for immunization: Secondary | ICD-10-CM | POA: Diagnosis not present

## 2019-08-20 ENCOUNTER — Other Ambulatory Visit: Payer: Self-pay

## 2019-08-20 DIAGNOSIS — Z20822 Contact with and (suspected) exposure to covid-19: Secondary | ICD-10-CM

## 2019-08-20 DIAGNOSIS — Z20828 Contact with and (suspected) exposure to other viral communicable diseases: Secondary | ICD-10-CM | POA: Diagnosis not present

## 2019-08-21 LAB — NOVEL CORONAVIRUS, NAA: SARS-CoV-2, NAA: DETECTED — AB

## 2019-09-24 ENCOUNTER — Other Ambulatory Visit: Payer: Self-pay | Admitting: Family Medicine

## 2019-09-24 MED ORDER — PRAVASTATIN SODIUM 40 MG PO TABS: 40.0000 mg | ORAL_TABLET | Freq: Every day | ORAL | 0 refills | Status: DC

## 2019-09-24 MED ORDER — PANTOPRAZOLE SODIUM 20 MG PO TBEC: 20.0000 mg | DELAYED_RELEASE_TABLET | Freq: Every day | ORAL | 0 refills | Status: DC

## 2019-09-24 MED ORDER — METOPROLOL SUCCINATE ER 50 MG PO TB24: 50.0000 mg | ORAL_TABLET | Freq: Every day | ORAL | 0 refills | Status: DC

## 2019-09-24 MED ORDER — CELECOXIB 200 MG PO CAPS: 200.0000 mg | ORAL_CAPSULE | Freq: Every day | ORAL | 0 refills | Status: DC | PRN

## 2019-09-27 ENCOUNTER — Other Ambulatory Visit: Payer: Self-pay | Admitting: Family Medicine

## 2019-09-30 ENCOUNTER — Other Ambulatory Visit: Payer: Self-pay | Admitting: Family Medicine

## 2019-09-30 MED ORDER — CELECOXIB 200 MG PO CAPS: 200.0000 mg | ORAL_CAPSULE | Freq: Every day | ORAL | 1 refills | Status: DC

## 2019-10-03 ENCOUNTER — Other Ambulatory Visit: Payer: Self-pay

## 2019-10-03 NOTE — Progress Notes (Signed)
Subjective:   Kelsey Stanley is a 68 y.o. female who presents for Medicare Annual (Subsequent) preventive examination.  Review of Systems:  Home Safety/Smoke Alarms: Feels safe in home. Smoke alarms in place.  Lives in 1 story home. Walk-in shower w/ bench.   Female:       Mammo- 03/20/19      Dexa scan-  10/04/18      CCS- pt reports last done 06/2018 at [facility]. Will request report again.       Objective:     Vitals: BP 122/80 (BP Location: Left Arm, Patient Position: Sitting, Cuff Size: Normal) Comment: All vitals done by R.Ewing CMA  Pulse 74   Ht 5\' 2"  (1.575 m)   Wt 141 lb (64 kg)   SpO2 98%   BMI 25.79 kg/m   Body mass index is 25.79 kg/m.  Advanced Directives 10/04/2019 10/02/2018  Does Patient Have a Medical Advance Directive? No No  Would patient like information on creating a medical advance directive? No - Patient declined No - Patient declined    Tobacco Social History   Tobacco Use  Smoking Status Never Smoker  Smokeless Tobacco Never Used     Counseling given: Not Answered   Clinical Intake:     Pain : No/denies pain   Past Medical History:  Diagnosis Date  . GERD (gastroesophageal reflux disease)   . Hyperlipidemia   . Hypertension    Past Surgical History:  Procedure Laterality Date  . ABDOMINAL HYSTERECTOMY  1993  . BREAST BIOPSY Right 1994  . CHOLECYSTECTOMY, LAPAROSCOPIC  1993   Family History  Problem Relation Age of Onset  . Diabetes Mother   . Hyperlipidemia Mother   . Hypertension Mother   . Glaucoma Mother   . Cancer Mother        bladder cancer  . Heart disease Father   . Diabetes Sister   . Hypertension Sister   . Hyperlipidemia Brother   . Hypertension Brother   . Diabetes Sister   . Hypertension Sister    Social History   Socioeconomic History  . Marital status: Married    Spouse name: Not on file  . Number of children: Not on file  . Years of education: Not on file  . Highest education level:  Not on file  Occupational History  . Not on file  Tobacco Use  . Smoking status: Never Smoker  . Smokeless tobacco: Never Used  Substance and Sexual Activity  . Alcohol use: Never  . Drug use: Never  . Sexual activity: Yes  Other Topics Concern  . Not on file  Social History Narrative  . Not on file   Social Determinants of Health   Financial Resource Strain:   . Difficulty of Paying Living Expenses: Not on file  Food Insecurity:   . Worried About Charity fundraiser in the Last Year: Not on file  . Ran Out of Food in the Last Year: Not on file  Transportation Needs:   . Lack of Transportation (Medical): Not on file  . Lack of Transportation (Non-Medical): Not on file  Physical Activity:   . Days of Exercise per Week: Not on file  . Minutes of Exercise per Session: Not on file  Stress:   . Feeling of Stress : Not on file  Social Connections:   . Frequency of Communication with Friends and Family: Not on file  . Frequency of Social Gatherings with Friends and Family: Not on  file  . Attends Religious Services: Not on file  . Active Member of Clubs or Organizations: Not on file  . Attends Archivist Meetings: Not on file  . Marital Status: Not on file    Outpatient Encounter Medications as of 10/04/2019  Medication Sig  . celecoxib (CELEBREX) 200 MG capsule Take 1 capsule (200 mg total) by mouth daily.  . cetirizine (ZYRTEC) 10 MG tablet Take 10 mg by mouth daily.  . cholecalciferol (VITAMIN D) 400 units TABS tablet Take 400 Units by mouth.  . metoprolol succinate (TOPROL-XL) 50 MG 24 hr tablet Take 1 tablet (50 mg total) by mouth daily. Take with or immediately following a meal.  . pantoprazole (PROTONIX) 20 MG tablet Take 1 tablet (20 mg total) by mouth daily.  . pravastatin (PRAVACHOL) 40 MG tablet Take 1 tablet (40 mg total) by mouth daily.  . Probiotic Product (PROBIOTIC-10 PO) Take by mouth.  . [DISCONTINUED] Glucosamine-MSM-Hyaluronic Acd (JOINT HEALTH  PO) Take by mouth.   No facility-administered encounter medications on file as of 10/04/2019.    Activities of Daily Living In your present state of health, do you have any difficulty performing the following activities: 10/04/2019  Hearing? N  Vision? N  Difficulty concentrating or making decisions? N  Walking or climbing stairs? N  Dressing or bathing? N  Doing errands, shopping? N  Preparing Food and eating ? N  Using the Toilet? N  In the past six months, have you accidently leaked urine? N  Do you have problems with loss of bowel control? N  Managing your Medications? N  Managing your Finances? N  Housekeeping or managing your Housekeeping? N  Some recent data might be hidden    Patient Care Team: Shelda Pal, DO as PCP - General (Family Medicine)    Assessment:   This is a routine wellness examination for [place]. Physical assessment deferred to PCP.   Exercise Activities and Dietary recommendations Current Exercise Habits: The patient does not participate in regular exercise at present, Exercise limited by: None identified Diet (meal preparation, eat out, water intake, caffeinated beverages, dairy products, fruits and vegetables): 24 hr recall Breakfast: oatmeal Lunch: snack Dinner: can of tuna and carrots and tomatoes  Goals    . Increase physical activity     Do exercise videos       Fall Risk Fall Risk  10/04/2019 10/02/2018  Falls in the past year? 0 0  Follow up Education provided;Falls prevention discussed -    Depression Screen PHQ 2/9 Scores 10/04/2019 10/02/2018  PHQ - 2 Score 0 0     Cognitive Function Ad8 score reviewed for issues:  Issues making decisions:no  Less interest in hobbies / activities:no  Repeats questions, stories (family complaining):no  Trouble using ordinary gadgets (microwave, computer, phone):no  Forgets the month or year: no  Mismanaging finances: no  Remembering appts:no  Daily problems with  thinking and/or memory:no Ad8 score is=0         Immunization History  Administered Date(s) Administered  . Fluad Quad(high Dose 65+) 07/18/2019  . Influenza-Unspecified 08/09/2016, 06/19/2018  . Pneumococcal Conjugate-13 06/19/2018  . Tdap 10/02/2018  . Zoster Recombinat (Shingrix) 01/16/2019, 06/18/2019   Screening Tests Health Maintenance  Topic Date Due  . MAMMOGRAM  03/19/2021  . COLONOSCOPY  06/17/2028  . TETANUS/TDAP  10/02/2028  . INFLUENZA VACCINE  Completed  . DEXA SCAN  Completed  . Hepatitis C Screening  Completed  . PNA vac Low Risk Adult  Completed  Plan:   See you next year!  Continue to eat heart healthy diet (full of fruits, vegetables, whole grains, lean protein, water--limit salt, fat, and sugar intake) and increase physical activity as tolerated.  Continue doing brain stimulating activities (puzzles, reading, adult coloring books, staying active) to keep memory sharp.   Bring a copy of your living will and/or healthcare power of attorney to your next office visit.    I have personally reviewed and noted the following in the patient's chart:   . Medical and social history . Use of alcohol, tobacco or illicit drugs  . Current medications and supplements . Functional ability and status . Nutritional status . Physical activity . Advanced directives . List of other physicians . Hospitalizations, surgeries, and ER visits in previous 12 months . Vitals . Screenings to include cognitive, depression, and falls . Referrals and appointments  In addition, I have reviewed and discussed with patient certain preventive protocols, quality metrics, and best practice recommendations. A written personalized care plan for preventive services as well as general preventive health recommendations were provided to patient.     Shela Nevin, [place]  10/04/2019

## 2019-10-04 ENCOUNTER — Ambulatory Visit (INDEPENDENT_AMBULATORY_CARE_PROVIDER_SITE_OTHER): Payer: Medicare Other | Admitting: *Deleted

## 2019-10-04 ENCOUNTER — Encounter: Payer: Self-pay | Admitting: *Deleted

## 2019-10-04 ENCOUNTER — Encounter: Payer: Self-pay | Admitting: Family Medicine

## 2019-10-04 ENCOUNTER — Ambulatory Visit (INDEPENDENT_AMBULATORY_CARE_PROVIDER_SITE_OTHER): Payer: Medicare Other | Admitting: Family Medicine

## 2019-10-04 VITALS — BP 122/80 | HR 74 | Temp 96.0°F | Ht 62.0 in | Wt 141.0 lb

## 2019-10-04 VITALS — BP 122/80 | HR 74 | Ht 62.0 in | Wt 141.0 lb

## 2019-10-04 DIAGNOSIS — R739 Hyperglycemia, unspecified: Secondary | ICD-10-CM | POA: Diagnosis not present

## 2019-10-04 DIAGNOSIS — E7800 Pure hypercholesterolemia, unspecified: Secondary | ICD-10-CM

## 2019-10-04 DIAGNOSIS — K219 Gastro-esophageal reflux disease without esophagitis: Secondary | ICD-10-CM | POA: Diagnosis not present

## 2019-10-04 DIAGNOSIS — I10 Essential (primary) hypertension: Secondary | ICD-10-CM

## 2019-10-04 DIAGNOSIS — Z0000 Encounter for general adult medical examination without abnormal findings: Secondary | ICD-10-CM

## 2019-10-04 LAB — COMPREHENSIVE METABOLIC PANEL
ALT: 17 U/L (ref 0–35)
AST: 17 U/L (ref 0–37)
Albumin: 4.2 g/dL (ref 3.5–5.2)
Alkaline Phosphatase: 72 U/L (ref 39–117)
BUN: 14 mg/dL (ref 6–23)
CO2: 30 mEq/L (ref 19–32)
Calcium: 9.5 mg/dL (ref 8.4–10.5)
Chloride: 106 mEq/L (ref 96–112)
Creatinine, Ser: 0.84 mg/dL (ref 0.40–1.20)
GFR: 67.62 mL/min (ref >60.00)
Glucose, Bld: 103 mg/dL — ABNORMAL HIGH (ref 70–99)
Potassium: 4 mEq/L (ref 3.5–5.1)
Sodium: 142 mEq/L (ref 135–145)
Total Bilirubin: 0.4 mg/dL (ref 0.2–1.2)
Total Protein: 7.1 g/dL (ref 6.0–8.3)

## 2019-10-04 LAB — HEMOGLOBIN A1C: Hgb A1c MFr Bld: 6.1 % (ref 4.6–6.5)

## 2019-10-04 LAB — LIPID PANEL
Cholesterol: 178 mg/dL (ref 0–200)
HDL: 51.6 mg/dL (ref >39.00)
LDL Cholesterol: 105 mg/dL — ABNORMAL HIGH (ref 0–99)
NonHDL: 125.96
Total CHOL/HDL Ratio: 3
Triglycerides: 105 mg/dL (ref 0.0–149.0)
VLDL: 21 mg/dL (ref 0.0–40.0)

## 2019-10-04 NOTE — Patient Instructions (Signed)
See you next year!  Continue to eat heart healthy diet (full of fruits, vegetables, whole grains, lean protein, water--limit salt, fat, and sugar intake) and increase physical activity as tolerated.  Continue doing brain stimulating activities (puzzles, reading, adult coloring books, staying active) to keep memory sharp.   Bring a copy of your living will and/or healthcare power of attorney to your next office visit.   Kelsey Stanley , Thank you for taking time to come for your Medicare Wellness Visit. I appreciate your ongoing commitment to your health goals. Please review the following plan we discussed and let me know if I can assist you in the future.   These are the goals we discussed: Goals    . Increase physical activity     Do exercise videos       This is a list of the screening recommended for you and due dates:  Health Maintenance  Topic Date Due  . Mammogram  03/19/2021  . Colon Cancer Screening  06/17/2028  . Tetanus Vaccine  10/02/2028  . Flu Shot  Completed  . DEXA scan (bone density measurement)  Completed  .  Hepatitis C: One time screening is recommended by Center for Disease Control  (CDC) for  adults born from [birth year] through 1964.   Completed  . Pneumonia vaccines  Completed    Preventive Care 37 Years and Older, Female Preventive care refers to lifestyle choices and visits with your health care provider that can promote health and wellness. This includes:  A yearly physical exam. This is also called an annual well check.  Regular dental and eye exams.  Immunizations.  Screening for certain conditions.  Healthy lifestyle choices, such as diet and exercise. What can I expect for my preventive care visit? Physical exam Your health care provider will check:  Height and weight. These may be used to calculate body mass index (BMI), which is a measurement that tells if you are at a healthy weight.  Heart rate and blood pressure.  Your skin for abnormal  spots. Counseling Your health care provider may ask you questions about:  Alcohol, tobacco, and drug use.  Emotional well-being.  Home and relationship well-being.  Sexual activity.  Eating habits.  History of falls.  Memory and ability to understand (cognition).  Work and work Statistician.  Pregnancy and menstrual history. What immunizations do I need?  Influenza (flu) vaccine  This is recommended every year. Tetanus, diphtheria, and pertussis (Tdap) vaccine  You may need a Td booster every 10 years. Varicella (chickenpox) vaccine  You may need this vaccine if you have not already been vaccinated. Zoster (shingles) vaccine  You may need this after age 77. Pneumococcal conjugate (PCV13) vaccine  One dose is recommended after age 72. Pneumococcal polysaccharide (PPSV23) vaccine  One dose is recommended after age 34. Measles, mumps, and rubella (MMR) vaccine  You may need at least one dose of MMR if you were born in 1957 or later. You may also need a second dose. Meningococcal conjugate (MenACWY) vaccine  You may need this if you have certain conditions. Hepatitis A vaccine  You may need this if you have certain conditions or if you travel or work in places where you may be exposed to hepatitis A. Hepatitis B vaccine  You may need this if you have certain conditions or if you travel or work in places where you may be exposed to hepatitis B. Haemophilus influenzae type b (Hib) vaccine  You may need this if you  have certain conditions. You may receive vaccines as individual doses or as more than one vaccine together in one shot (combination vaccines). Talk with your health care provider about the risks and benefits of combination vaccines. What tests do I need? Blood tests  Lipid and cholesterol levels. These may be checked every 5 years, or more frequently depending on your overall health.  Hepatitis C test.  Hepatitis B test. Screening  Lung cancer  screening. You may have this screening every year starting at age 51 if you have a 30-pack-year history of smoking and currently smoke or have quit within the past 15 years.  Colorectal cancer screening. All adults should have this screening starting at age 57 and continuing until age 6. Your health care provider may recommend screening at age 69 if you are at increased risk. You will have tests every 1-10 years, depending on your results and the type of screening test.  Diabetes screening. This is done by checking your blood sugar (glucose) after you have not eaten for a while (fasting). You may have this done every 1-3 years.  Mammogram. This may be done every 1-2 years. Talk with your health care provider about how often you should have regular mammograms.  BRCA-related cancer screening. This may be done if you have a family history of breast, ovarian, tubal, or peritoneal cancers. Other tests  Sexually transmitted disease (STD) testing.  Bone density scan. This is done to screen for osteoporosis. You may have this done starting at age 36. Follow these instructions at home: Eating and drinking  Eat a diet that includes fresh fruits and vegetables, whole grains, lean protein, and low-fat dairy products. Limit your intake of foods with high amounts of sugar, saturated fats, and salt.  Take vitamin and mineral supplements as recommended by your health care provider.  Do not drink alcohol if your health care provider tells you not to drink.  If you drink alcohol: ? Limit how much you have to 0-1 drink a day. ? Be aware of how much alcohol is in your drink. In the [place] one drink equals one 12 oz bottle of beer (355 mL), one 5 oz glass of wine (148 mL), or one 1 oz glass of hard liquor (44 mL). Lifestyle  Take daily care of your teeth and gums.  Stay active. Exercise for at least 30 minutes on 5 or more days each week.  Do not use any products that contain nicotine or tobacco, such as  cigarettes, e-cigarettes, and chewing tobacco. If you need help quitting, ask your health care provider.  If you are sexually active, practice safe sex. Use a condom or other form of protection in order to prevent STIs (sexually transmitted infections).  Talk with your health care provider about taking a low-dose aspirin or statin. What's next?  Go to your health care provider once a year for a well check visit.  Ask your health care provider how often you should have your eyes and teeth checked.  Stay up to date on all vaccines. This information is not intended to replace advice given to you by your health care provider. Make sure you discuss any questions you have with your health care provider. Document Released: 10/30/2015 Document Revised: 09/27/2018 Document Reviewed: 09/27/2018 Elsevier Patient Education  2020 [employer].

## 2019-10-04 NOTE — Patient Instructions (Signed)
Give us 2-3 business days to get the results of your labs back.   Keep the diet clean and stay active.  Let us know if you need anything. 

## 2019-10-04 NOTE — Progress Notes (Signed)
Chief Complaint  Patient presents with  . Annual Exam    Subjective Kelsey Stanley is a 68 y.o. female who presents for hypertension follow up. She does not monitor home blood pressures. She is compliant with medication- Toprol XL 50 mg/d. Patient has these side effects of medication: none She is adhering to a healthy diet overall. Current exercise: active in yard  Hyperlipidemia Patient presents for dyslipidemia follow up. Currently being treated with pravastatin 40 mg/d and compliance with treatment thus far has been good. She denies myalgias. Diet and exercise as above.  The patient is not known to have coexisting coronary artery disease.  GERD Well controlled s/s's w Protonix 20 mg/d. Compliant, no AE's.  Came off of med, but s/s's came back. No wt loss, blood in stool, N/V.    Past Medical History:  Diagnosis Date  . GERD (gastroesophageal reflux disease)   . Hyperlipidemia   . Hypertension     Review of Systems Cardiovascular: no chest pain Respiratory:  no shortness of breath  Exam BP 122/80 (BP Location: Left Arm, Patient Position: Sitting, Cuff Size: Normal)   Pulse 74   Temp (!) 96 F (35.6 C) (Temporal)   Ht '5\' 2"'  (1.575 m)   Wt 141 lb (64 kg)   SpO2 98%   BMI 25.79 kg/m  General:  well developed, well nourished, in no apparent distress Heart: RRR, no bruits, no LE edema Lungs: clear to auscultation, no accessory muscle use Psych: well oriented with normal range of affect and appropriate judgment/insight  Essential hypertension - Plan: Comp Met (CMET)  Pure hypercholesterolemia - Plan: Comp Met (CMET), Lipid Profile  Gastroesophageal reflux disease, unspecified whether esophagitis present  Hyperglycemia - Plan: HgB A1c  Orders as above. Counseled on diet and exercise. F/u in 6 mo. The patient voiced understanding and agreement to the plan.  [place], DO 10/04/19  8:38 AM

## 2019-10-21 ENCOUNTER — Encounter: Payer: Self-pay | Admitting: Family Medicine

## 2019-10-22 MED ORDER — METOPROLOL SUCCINATE ER 50 MG PO TB24: 50.0000 mg | ORAL_TABLET | Freq: Every day | ORAL | 0 refills | Status: DC

## 2019-10-22 MED ORDER — PANTOPRAZOLE SODIUM 20 MG PO TBEC: 20.0000 mg | DELAYED_RELEASE_TABLET | Freq: Every day | ORAL | 0 refills | Status: DC

## 2019-10-22 MED ORDER — PRAVASTATIN SODIUM 40 MG PO TABS: 40.0000 mg | ORAL_TABLET | Freq: Every day | ORAL | 0 refills | Status: DC

## 2019-11-06 ENCOUNTER — Ambulatory Visit: Payer: Medicare Other | Attending: Internal Medicine

## 2019-11-06 DIAGNOSIS — Z23 Encounter for immunization: Secondary | ICD-10-CM | POA: Diagnosis not present

## 2019-11-06 NOTE — Progress Notes (Signed)
   Covid-19 Vaccination Clinic  Name:  SYBRINA STOOPS    MRN: [id number] DOB: Oct 28, 1950  11/06/2019  Ms. Sax was observed post Covid-19 immunization for 15 minutes without incidence. She was provided with Vaccine Information Sheet and instruction to access the V-Safe system.   Ms. Hurney was instructed to call 911 with any severe reactions post vaccine: Marland Kitchen Difficulty breathing  . Swelling of your face and throat  . A fast heartbeat  . A bad rash all over your body  . Dizziness and weakness    Immunizations Administered    Name Date Dose VIS Date Route   Pfizer COVID-19 Vaccine 11/06/2019  5:54 PM 0.3 mL 09/27/2019 Intramuscular   Manufacturer: [place]   Lot: [id number]   [place]: [id number]

## 2019-11-25 ENCOUNTER — Ambulatory Visit: Payer: Medicare Other

## 2019-11-27 ENCOUNTER — Ambulatory Visit: Payer: Medicare Other | Attending: Internal Medicine

## 2019-11-27 DIAGNOSIS — Z23 Encounter for immunization: Secondary | ICD-10-CM | POA: Insufficient documentation

## 2019-11-27 NOTE — Progress Notes (Signed)
   Covid-19 Vaccination Clinic  Name:  Kelsey Stanley    MRN: [id number] DOB: 11-17-1950  11/27/2019  [name] was observed post Covid-19 immunization for 15 minutes without incidence. She was provided with Vaccine Information Sheet and instruction to access the V-Safe system.   [name] was instructed to call 911 with any severe reactions post vaccine: Marland Kitchen Difficulty breathing  . Swelling of your face and throat  . A fast heartbeat  . A bad rash all over your body  . Dizziness and weakness    Immunizations Administered    Name Date Dose VIS Date Route   Pfizer COVID-19 Vaccine 11/27/2019 12:21 PM 0.3 mL 09/27/2019 Intramuscular   Manufacturer: [place]   Lot: [id number]   [place]: [id number]

## 2019-11-28 ENCOUNTER — Ambulatory Visit: Payer: Medicare Other

## 2019-12-30 DIAGNOSIS — H43811 Vitreous degeneration, right eye: Secondary | ICD-10-CM | POA: Diagnosis not present

## 2019-12-31 ENCOUNTER — Other Ambulatory Visit: Payer: Self-pay

## 2019-12-31 ENCOUNTER — Ambulatory Visit (INDEPENDENT_AMBULATORY_CARE_PROVIDER_SITE_OTHER): Payer: Medicare Other | Admitting: Medical

## 2019-12-31 VITALS — BP 148/76 | HR 89 | Temp 96.0°F | Resp 16 | Ht 61.0 in | Wt 142.6 lb

## 2019-12-31 DIAGNOSIS — J329 Chronic sinusitis, unspecified: Secondary | ICD-10-CM | POA: Diagnosis not present

## 2019-12-31 DIAGNOSIS — H6690 Otitis media, unspecified, unspecified ear: Secondary | ICD-10-CM | POA: Diagnosis not present

## 2019-12-31 DIAGNOSIS — H9201 Otalgia, right ear: Secondary | ICD-10-CM | POA: Diagnosis not present

## 2019-12-31 DIAGNOSIS — J3489 Other specified disorders of nose and nasal sinuses: Secondary | ICD-10-CM | POA: Diagnosis not present

## 2019-12-31 MED ORDER — DOXYCYCLINE HYCLATE 100 MG PO TABS: 100.0000 mg | ORAL_TABLET | Freq: Two times a day (BID) | ORAL | 0 refills | Status: DC

## 2019-12-31 MED ORDER — NEOMYCIN-POLYMYXIN-HC 3.5-10000-1 OT SOLN: 3.0000 [drp] | Freq: Four times a day (QID) | OTIC | 0 refills | Status: DC

## 2019-12-31 NOTE — Progress Notes (Signed)
Subjective:    Patient ID: Kelsey Stanley, female    DOB: 1951-01-02, 69 y.o.   MRN: [id number]  HPI  Pt in with some rt side ear discomfort. Pain has been on and off for 3 days. Some pain beneath ear and little behind. Ear feels full and hearing muffled. No recent nasal congestion. Does report rt side upper teeth faint pain and maxillary area pressure.  Pt does get occasional sinus infection.  Pt is taking zyrtec daily. Hx of year runny. Pt has nasonex at home.   Review of Systems  Constitutional: Negative for chills, fatigue and fever.  HENT: Positive for ear pain, sinus pressure and sinus pain. Negative for congestion.   Respiratory: Negative for cough, chest tightness and wheezing.   Cardiovascular: Negative for chest pain and palpitations.  Gastrointestinal: Negative for abdominal pain.  Neurological: Negative for dizziness and headaches.  Hematological: Positive for adenopathy.  Psychiatric/Behavioral: Negative for behavioral problems and confusion.   Past Medical History:  Diagnosis Date  . GERD (gastroesophageal reflux disease)   . Hyperlipidemia   . Hypertension      Social History   Socioeconomic History  . Marital status: Married    Spouse name: Not on file  . Number of children: Not on file  . Years of education: Not on file  . Highest education level: Not on file  Occupational History  . Not on file  Tobacco Use  . Smoking status: Never Smoker  . Smokeless tobacco: Never Used  Substance and Sexual Activity  . Alcohol use: Never  . Drug use: Never  . Sexual activity: Yes  Other Topics Concern  . Not on file  Social History Narrative  . Not on file   Social Determinants of Health   Financial Resource Strain:   . Difficulty of Paying Living Expenses:   Food Insecurity:   . Worried About Charity fundraiser in the Last Year:   . Arboriculturist in the Last Year:   Transportation Needs:   . Film/video editor (Medical):   Marland Kitchen Lack of  Transportation (Non-Medical):   Physical Activity:   . Days of Exercise per Week:   . Minutes of Exercise per Session:   Stress:   . Feeling of Stress :   Social Connections:   . Frequency of Communication with Friends and Family:   . Frequency of Social Gatherings with Friends and Family:   . Attends Religious Services:   . Active Member of Clubs or Organizations:   . Attends Archivist Meetings:   Marland Kitchen Marital Status:   Intimate Partner Violence:   . Fear of Current or Ex-Partner:   . Emotionally Abused:   Marland Kitchen Physically Abused:   . Sexually Abused:     Past Surgical History:  Procedure Laterality Date  . ABDOMINAL HYSTERECTOMY  1993  . BREAST BIOPSY Right 1994  . CHOLECYSTECTOMY, LAPAROSCOPIC  1993    Family History  Problem Relation Age of Onset  . Diabetes Mother   . Hyperlipidemia Mother   . Hypertension Mother   . Glaucoma Mother   . Cancer Mother        bladder cancer  . Heart disease Father   . Diabetes Sister   . Hypertension Sister   . Hyperlipidemia Brother   . Hypertension Brother   . Diabetes Sister   . Hypertension Sister     Allergies  Allergen Reactions  . Morphine And Related Other (See Comments)  Unable to communicate  . Amoxicillin Rash  . Erythromycin Rash    Current Outpatient Medications on File Prior to Visit  Medication Sig Dispense Refill  . celecoxib (CELEBREX) 200 MG capsule Take 1 capsule (200 mg total) by mouth daily. 90 capsule 1  . cetirizine (ZYRTEC) 10 MG tablet Take 10 mg by mouth daily.    . cholecalciferol (VITAMIN D) 400 units TABS tablet Take 400 Units by mouth.    . metoprolol succinate (TOPROL-XL) 50 MG 24 hr tablet Take 1 tablet (50 mg total) by mouth daily. Take with or immediately following a meal. 90 tablet 0  . pantoprazole (PROTONIX) 20 MG tablet Take 1 tablet (20 mg total) by mouth daily. 90 tablet 0  . pravastatin (PRAVACHOL) 40 MG tablet Take 1 tablet (40 mg total) by mouth daily. 90 tablet 0  .  Probiotic Product (PROBIOTIC-10 PO) Take by mouth.     No current facility-administered medications on file prior to visit.    BP (!) 148/76 (BP Location: Left Arm, Patient Position: Sitting, Cuff Size: Normal)   Pulse 89   Temp (!) 96 F (35.6 C) (Temporal)   Resp 16   Ht 5\' 1"  (1.549 m)   Wt 142 lb 9.6 oz (64.7 kg)   SpO2 95%   BMI 26.94 kg/m       Objective:   Physical Exam  General  Mental Status - Alert. General Appearance - Well groomed. Not in acute distress.  Skin Rashes- No Rashes.  HEENT Head- Normal. Ear Auditory Canal - Left- Normal. Right - small amount of wax present.Tympanic Membrane- Left- Normal. Right- dull pinkish red portin of tm seen Eye Sclera/Conjunctiva- Left- Normal. Right- Normal. Nose & Sinuses Nasal Mucosa- Left-  Boggy and Congested. Right-  Boggy and  Congested.rt side maxillary sinus pressure mild.   Neck Neck- Supple. No Masses. Mid enlarged lymph node rt side upper neck beneath ear.   Chest and Lung Exam Auscultation: Breath Sounds:-Clear even and unlabored.  Cardiovascular Auscultation:Rythm- Regular, rate and rhythm. Murmurs & Other Heart Sounds:Ausculatation of the heart reveal- No Murmurs.  Lymphatic Head & Neck General Head & Neck Lymphatics: Bilateral: Description- No Localized lymphadenopathy. See neck exam.       Assessment & Plan:  By exam and recent history, I have concern for sinus infection and right ear infection.  I am prescribing doxycycline antibiotic.  Rx advisement given.  I do want you to start back on your Nasonex and also prescribing Cortisporin otic drops.  I expect you to gradually improve but if signs symptoms worsen or change let me know.  Follow-up in 10 days or as needed.  Currently small amount of wax present does not appear to be playing a role in your symptoms.  25 minutes spent in patient today.  Mackie Pai, PA-C

## 2019-12-31 NOTE — Patient Instructions (Addendum)
By exam and recent history, I have concern for sinus infection and right ear infection.  I am prescribing doxycycline antibiotic.  Rx advisement given.  I do want you to start back on your Nasonex and also prescribing Cortisporin otic drops.  I expect you to gradually improve but if signs symptoms worsen or change let me know.  Follow-up in 10 days or as needed.  Currently small amount of wax present does not appear to be playing a role in your symptoms.

## 2020-01-06 DIAGNOSIS — H40013 Open angle with borderline findings, low risk, bilateral: Secondary | ICD-10-CM | POA: Diagnosis not present

## 2020-01-10 ENCOUNTER — Ambulatory Visit: Payer: Medicare Other | Admitting: Medical

## 2020-02-03 ENCOUNTER — Telehealth: Payer: Self-pay | Admitting: Family Medicine

## 2020-02-03 NOTE — Telephone Encounter (Signed)
Patient states we billed the wrong insurance company [facility], patient has [place] /medicare   Please rebill to correct insurance

## 2020-02-04 NOTE — Telephone Encounter (Signed)
Left a message for the patient on her VM letting her know I have sent a message to [employer] asking them to submit charges to her secondary insurance carrier, [employer] and if patient has any further questions, she can call the office and ask to speak with me.

## 2020-02-12 DIAGNOSIS — H90A32 Mixed conductive and sensorineural hearing loss, unilateral, left ear with restricted hearing on the contralateral side: Secondary | ICD-10-CM | POA: Diagnosis not present

## 2020-02-12 DIAGNOSIS — H608X9 Other otitis externa, unspecified ear: Secondary | ICD-10-CM | POA: Diagnosis not present

## 2020-03-20 DIAGNOSIS — H8092 Unspecified otosclerosis, left ear: Secondary | ICD-10-CM | POA: Diagnosis not present

## 2020-03-23 ENCOUNTER — Encounter: Payer: Self-pay | Admitting: Family Medicine

## 2020-03-23 DIAGNOSIS — Z1231 Encounter for screening mammogram for malignant neoplasm of breast: Secondary | ICD-10-CM | POA: Diagnosis not present

## 2020-04-03 ENCOUNTER — Other Ambulatory Visit: Payer: Self-pay

## 2020-04-03 ENCOUNTER — Encounter: Payer: Self-pay | Admitting: Family Medicine

## 2020-04-03 ENCOUNTER — Ambulatory Visit (INDEPENDENT_AMBULATORY_CARE_PROVIDER_SITE_OTHER): Payer: Medicare Other | Admitting: Family Medicine

## 2020-04-03 VITALS — BP 134/82 | HR 74 | Temp 96.0°F | Ht 62.0 in | Wt 141.2 lb

## 2020-04-03 DIAGNOSIS — I10 Essential (primary) hypertension: Secondary | ICD-10-CM | POA: Diagnosis not present

## 2020-04-03 DIAGNOSIS — K219 Gastro-esophageal reflux disease without esophagitis: Secondary | ICD-10-CM | POA: Diagnosis not present

## 2020-04-03 DIAGNOSIS — E785 Hyperlipidemia, unspecified: Secondary | ICD-10-CM | POA: Diagnosis not present

## 2020-04-03 MED ORDER — PANTOPRAZOLE SODIUM 20 MG PO TBEC: 20.0000 mg | DELAYED_RELEASE_TABLET | Freq: Every day | ORAL | 2 refills | Status: DC

## 2020-04-03 MED ORDER — PRAVASTATIN SODIUM 40 MG PO TABS: 40.0000 mg | ORAL_TABLET | Freq: Every day | ORAL | 2 refills | Status: DC

## 2020-04-03 MED ORDER — METOPROLOL SUCCINATE ER 50 MG PO TB24: 50.0000 mg | ORAL_TABLET | Freq: Every day | ORAL | 2 refills | Status: DC

## 2020-04-03 NOTE — Progress Notes (Signed)
Chief Complaint  Patient presents with  . Follow-up    medication refills    Subjective Kelsey Stanley is a 69 y.o. female who presents for hypertension follow up. She does not monitor home blood pressures. She is compliant with medication- Toprol 50 mg/d Patient has these side effects of medication: none She is adhering to a healthy diet overall. Current exercise: some walking  Hyperlipidemia Patient presents for dyslipidemia follow up. Currently being treated with pravastatin 40 mg/d and compliance with treatment thus far has been good. She denies myalgias. Diet/exercise as above.  The patient is not known to have coexisting coronary artery disease.  GERD Takes Protonix 20 mg/d, reports compliance, no AE's. Reports good control of GERD. No wt loss, N/V.   Past Medical History:  Diagnosis Date  . GERD (gastroesophageal reflux disease)   . Hyperlipidemia   . Hypertension     Exam BP 134/82 (BP Location: Left Arm, Patient Position: Sitting, Cuff Size: Normal)   Pulse 74   Temp (!) 96 F (35.6 C) (Temporal)   Ht 5\' 2"  (1.575 m)   Wt 141 lb 4 oz (64.1 kg)   SpO2 98%   BMI 25.83 kg/m  General:  well developed, well nourished, in no apparent distress Heart: RRR, no bruits, no LE edema Lungs: clear to auscultation, no accessory muscle use Abd: S, NT, ND, BS+ Psych: well oriented with normal range of affect and appropriate judgment/insight  Essential hypertension  Hyperlipidemia, unspecified hyperlipidemia type  Gastroesophageal reflux disease, unspecified whether esophagitis present  1- Cont care. 2- Cont statin 3- Cont PPI.  F/u in 6 mo, will ck labs then. The patient voiced understanding and agreement to the plan.  [place], DO 04/03/20  9:00 AM

## 2020-04-03 NOTE — Patient Instructions (Addendum)
Keep up the good work.  Keep the diet clean and stay active.  Let us know if you need anything. 

## 2020-05-05 DIAGNOSIS — Z9049 Acquired absence of other specified parts of digestive tract: Secondary | ICD-10-CM | POA: Diagnosis not present

## 2020-05-05 DIAGNOSIS — Z825 Family history of asthma and other chronic lower respiratory diseases: Secondary | ICD-10-CM | POA: Diagnosis not present

## 2020-05-05 DIAGNOSIS — J309 Allergic rhinitis, unspecified: Secondary | ICD-10-CM | POA: Diagnosis not present

## 2020-05-05 DIAGNOSIS — H9192 Unspecified hearing loss, left ear: Secondary | ICD-10-CM | POA: Diagnosis not present

## 2020-05-05 DIAGNOSIS — Z90710 Acquired absence of both cervix and uterus: Secondary | ICD-10-CM | POA: Diagnosis not present

## 2020-05-05 DIAGNOSIS — H9012 Conductive hearing loss, unilateral, left ear, with unrestricted hearing on the contralateral side: Secondary | ICD-10-CM | POA: Diagnosis not present

## 2020-05-05 DIAGNOSIS — Z9889 Other specified postprocedural states: Secondary | ICD-10-CM | POA: Diagnosis not present

## 2020-05-05 DIAGNOSIS — Z833 Family history of diabetes mellitus: Secondary | ICD-10-CM | POA: Diagnosis not present

## 2020-05-05 DIAGNOSIS — Z885 Allergy status to narcotic agent status: Secondary | ICD-10-CM | POA: Diagnosis not present

## 2020-05-05 DIAGNOSIS — Z881 Allergy status to other antibiotic agents status: Secondary | ICD-10-CM | POA: Diagnosis not present

## 2020-05-05 DIAGNOSIS — I10 Essential (primary) hypertension: Secondary | ICD-10-CM | POA: Diagnosis not present

## 2020-05-05 DIAGNOSIS — K219 Gastro-esophageal reflux disease without esophagitis: Secondary | ICD-10-CM | POA: Diagnosis not present

## 2020-05-05 DIAGNOSIS — E785 Hyperlipidemia, unspecified: Secondary | ICD-10-CM | POA: Diagnosis not present

## 2020-05-05 DIAGNOSIS — Z818 Family history of other mental and behavioral disorders: Secondary | ICD-10-CM | POA: Diagnosis not present

## 2020-05-05 DIAGNOSIS — H8092 Unspecified otosclerosis, left ear: Secondary | ICD-10-CM | POA: Diagnosis not present

## 2020-05-05 DIAGNOSIS — Z8249 Family history of ischemic heart disease and other diseases of the circulatory system: Secondary | ICD-10-CM | POA: Diagnosis not present

## 2020-05-05 DIAGNOSIS — Z83438 Family history of other disorder of lipoprotein metabolism and other lipidemia: Secondary | ICD-10-CM | POA: Diagnosis not present

## 2020-05-05 DIAGNOSIS — Z79899 Other long term (current) drug therapy: Secondary | ICD-10-CM | POA: Diagnosis not present

## 2020-05-05 DIAGNOSIS — I160 Hypertensive urgency: Secondary | ICD-10-CM | POA: Diagnosis not present

## 2020-05-06 DIAGNOSIS — I169 Hypertensive crisis, unspecified: Secondary | ICD-10-CM | POA: Diagnosis not present

## 2020-05-06 DIAGNOSIS — Z79899 Other long term (current) drug therapy: Secondary | ICD-10-CM | POA: Diagnosis not present

## 2020-05-06 DIAGNOSIS — K219 Gastro-esophageal reflux disease without esophagitis: Secondary | ICD-10-CM | POA: Diagnosis not present

## 2020-05-06 DIAGNOSIS — H9012 Conductive hearing loss, unilateral, left ear, with unrestricted hearing on the contralateral side: Secondary | ICD-10-CM | POA: Diagnosis not present

## 2020-05-06 DIAGNOSIS — H9192 Unspecified hearing loss, left ear: Secondary | ICD-10-CM | POA: Diagnosis not present

## 2020-05-06 DIAGNOSIS — I160 Hypertensive urgency: Secondary | ICD-10-CM | POA: Diagnosis not present

## 2020-05-06 DIAGNOSIS — J309 Allergic rhinitis, unspecified: Secondary | ICD-10-CM | POA: Diagnosis not present

## 2020-05-06 DIAGNOSIS — I10 Essential (primary) hypertension: Secondary | ICD-10-CM | POA: Diagnosis not present

## 2020-05-07 ENCOUNTER — Inpatient Hospital Stay (HOSPITAL_COMMUNITY)
Admission: EM | Admit: 2020-05-07 | Discharge: 2020-05-09 | DRG: 305 | Disposition: A | Discharge status: Home / Self Care | Payer: Medicare Other | Attending: Internal Medicine | Admitting: Internal Medicine

## 2020-05-07 ENCOUNTER — Emergency Department (HOSPITAL_COMMUNITY): Payer: Medicare Other

## 2020-05-07 ENCOUNTER — Encounter (HOSPITAL_COMMUNITY): Payer: Self-pay

## 2020-05-07 DIAGNOSIS — Z20822 Contact with and (suspected) exposure to covid-19: Secondary | ICD-10-CM | POA: Diagnosis present

## 2020-05-07 DIAGNOSIS — Z833 Family history of diabetes mellitus: Secondary | ICD-10-CM | POA: Diagnosis not present

## 2020-05-07 DIAGNOSIS — I160 Hypertensive urgency: Principal | ICD-10-CM | POA: Diagnosis present

## 2020-05-07 DIAGNOSIS — Z881 Allergy status to other antibiotic agents status: Secondary | ICD-10-CM

## 2020-05-07 DIAGNOSIS — Z8249 Family history of ischemic heart disease and other diseases of the circulatory system: Secondary | ICD-10-CM | POA: Diagnosis not present

## 2020-05-07 DIAGNOSIS — Z83438 Family history of other disorder of lipoprotein metabolism and other lipidemia: Secondary | ICD-10-CM | POA: Diagnosis not present

## 2020-05-07 DIAGNOSIS — Z79899 Other long term (current) drug therapy: Secondary | ICD-10-CM | POA: Diagnosis not present

## 2020-05-07 DIAGNOSIS — E785 Hyperlipidemia, unspecified: Secondary | ICD-10-CM | POA: Diagnosis present

## 2020-05-07 DIAGNOSIS — I10 Essential (primary) hypertension: Secondary | ICD-10-CM | POA: Diagnosis present

## 2020-05-07 DIAGNOSIS — Z888 Allergy status to other drugs, medicaments and biological substances status: Secondary | ICD-10-CM

## 2020-05-07 DIAGNOSIS — R7303 Prediabetes: Secondary | ICD-10-CM

## 2020-05-07 DIAGNOSIS — K219 Gastro-esophageal reflux disease without esophagitis: Secondary | ICD-10-CM | POA: Diagnosis present

## 2020-05-07 DIAGNOSIS — R7989 Other specified abnormal findings of blood chemistry: Secondary | ICD-10-CM | POA: Diagnosis not present

## 2020-05-07 DIAGNOSIS — Z885 Allergy status to narcotic agent status: Secondary | ICD-10-CM

## 2020-05-07 LAB — CBC WITH DIFFERENTIAL/PLATELET
Abs Immature Granulocytes: 0.03 10*3/uL (ref 0.00–0.07)
Basophils Absolute: 0 10*3/uL (ref 0.0–0.1)
Basophils Relative: 0 %
Eosinophils Absolute: 0.1 10*3/uL (ref 0.0–0.5)
Eosinophils Relative: 1 %
HCT: 42.8 % (ref 36.0–46.0)
Hemoglobin: 13.6 g/dL (ref 12.0–15.0)
Immature Granulocytes: 0 %
Lymphocytes Relative: 27 %
Lymphs Abs: 2.7 10*3/uL (ref 0.7–4.0)
MCH: 27.9 pg (ref 26.0–34.0)
MCHC: 31.8 g/dL (ref 30.0–36.0)
MCV: 87.9 fL (ref 80.0–100.0)
Monocytes Absolute: 0.8 10*3/uL (ref 0.1–1.0)
Monocytes Relative: 8 %
Neutro Abs: 6.3 10*3/uL (ref 1.7–7.7)
Neutrophils Relative %: 64 %
Platelets: 331 10*3/uL (ref 150–400)
RBC: 4.87 MIL/uL (ref 3.87–5.11)
RDW: 12.4 % (ref 11.5–15.5)
WBC: 9.9 10*3/uL (ref 4.0–10.5)
nRBC: 0 % (ref 0.0–0.2)

## 2020-05-07 LAB — COMPREHENSIVE METABOLIC PANEL
ALT: 21 U/L (ref 0–44)
AST: 23 U/L (ref 15–41)
Albumin: 4.3 g/dL (ref 3.5–5.0)
Alkaline Phosphatase: 61 U/L (ref 38–126)
Anion gap: 10 (ref 5–15)
BUN: 19 mg/dL (ref 8–23)
CO2: 27 mmol/L (ref 22–32)
Calcium: 9.4 mg/dL (ref 8.9–10.3)
Chloride: 105 mmol/L (ref 98–111)
Creatinine, Ser: 0.88 mg/dL (ref 0.44–1.00)
GFR calc Af Amer: >60 mL/min (ref >60)
GFR calc non Af Amer: >60 mL/min (ref >60)
Glucose, Bld: 105 mg/dL — ABNORMAL HIGH (ref 70–99)
Potassium: 3.8 mmol/L (ref 3.5–5.1)
Sodium: 142 mmol/L (ref 135–145)
Total Bilirubin: 0.6 mg/dL (ref 0.3–1.2)
Total Protein: 7.5 g/dL (ref 6.5–8.1)

## 2020-05-07 LAB — TROPONIN I (HIGH SENSITIVITY)
Troponin I (High Sensitivity): 32 ng/L — ABNORMAL HIGH (ref <18)
Troponin I (High Sensitivity): 34 ng/L — ABNORMAL HIGH (ref <18)

## 2020-05-07 LAB — SARS CORONAVIRUS 2 BY RT PCR (HOSPITAL ORDER, PERFORMED IN ~~LOC~~ HOSPITAL LAB): SARS Coronavirus 2: NEGATIVE

## 2020-05-07 MED ORDER — ENOXAPARIN SODIUM 40 MG/0.4ML ~~LOC~~ SOLN
40.0000 mg | SUBCUTANEOUS | Status: DC
  Administered 2020-05-08 – 2020-05-09 (×2): 40 mg via SUBCUTANEOUS
  Filled 2020-05-07 (×2): qty 0.4

## 2020-05-07 MED ORDER — SODIUM CHLORIDE 0.9% FLUSH
3.0000 mL | Freq: Two times a day (BID) | INTRAVENOUS | Status: DC
  Administered 2020-05-07 – 2020-05-09 (×4): 3 mL via INTRAVENOUS

## 2020-05-07 MED ORDER — ACETAMINOPHEN 325 MG PO TABS: 650.0000 mg | ORAL_TABLET | ORAL | Status: DC | PRN

## 2020-05-07 MED ORDER — LABETALOL HCL 5 MG/ML IV SOLN
10.0000 mg | Freq: Once | INTRAVENOUS | Status: AC
  Administered 2020-05-07: 10 mg via INTRAVENOUS
  Filled 2020-05-07: qty 4

## 2020-05-07 MED ORDER — LISINOPRIL 20 MG PO TABS
40.0000 mg | ORAL_TABLET | Freq: Every day | ORAL | Status: DC
  Administered 2020-05-08 – 2020-05-09 (×2): 40 mg via ORAL
  Filled 2020-05-07 (×2): qty 2

## 2020-05-07 MED ORDER — LABETALOL HCL 5 MG/ML IV SOLN
20.0000 mg | Freq: Once | INTRAVENOUS | Status: AC
  Administered 2020-05-07: 20 mg via INTRAVENOUS
  Filled 2020-05-07: qty 4

## 2020-05-07 MED ORDER — LABETALOL HCL 5 MG/ML IV SOLN
10.0000 mg | INTRAVENOUS | Status: DC | PRN
  Administered 2020-05-08: 10 mg via INTRAVENOUS
  Filled 2020-05-07: qty 4

## 2020-05-07 NOTE — ED Triage Notes (Signed)
Pt presents with c/o hypertension. Pt reports that she has a hx of HTN, no other symptoms at this time. Pt reports she was told to come here by her PCP.

## 2020-05-07 NOTE — ED Provider Notes (Signed)
[facility] DEPT Provider Note   CSN: [id number] Arrival date & time: 05/07/20  1328     History Chief Complaint  Patient presents with  . Hypertension    Kelsey Stanley is a 69 y.o. female.  The history is provided by the patient and medical records. No language interpreter was used.  Hypertension     69 year old female with history of hypertension currently on metoprolol presents to ED at the recommendations of her PCP for evaluation of high blood pressure.  Patient report 5 days ago she had left ear surgery to restore her hearing.  She was noted to be hypertensive prior to her surgery.  After the surgery, she was having episodes of heart palpitation nausea as well as high blood pressure and was kept in the hospital for the management of her symptoms.  She mentioned her blood pressure was managed with antihypertensive during admission but she did develop some heart palpitations and dizziness with certain medications.  Her blood pressure subsequently was 130s and was discharged 2 days ago.  Last night when she checked her blood pressure it was elevated in the 200s.  She checked it again today and it was still maintained in the 371I systolic.  She reached out to her doctor who recommended patient come to ER for evaluation.  At this time, patient states she feels fine.  She does not complain of any headache.  Her hearing has regained.  She does not complain of any chest pain or trouble breathing.  She denies any focal numbness or focal weakness.  She normally takes her blood pressure medication at nighttime and therefore she has not been on any blood pressure medications for the day.  She denies any other medication changes.  Patient states that she has history of heartburn and to report having severe heartburn earlier today improved with the medication.  Denies any other chest pain.  Past Medical History:  Diagnosis Date  . GERD (gastroesophageal reflux  disease)   . Hyperlipidemia   . Hypertension     Patient Active Problem List   Diagnosis Date Noted  . Gastroesophageal reflux disease 10/02/2018  . Essential hypertension 10/02/2018  . Hyperlipidemia 10/02/2018  . Greater trochanteric bursitis of left hip 08/20/2018    Past Surgical History:  Procedure Laterality Date  . ABDOMINAL HYSTERECTOMY  1993  . BREAST BIOPSY Right 1994  . CHOLECYSTECTOMY, LAPAROSCOPIC  1993     OB History   No obstetric history on file.     Family History  Problem Relation Age of Onset  . Diabetes Mother   . Hyperlipidemia Mother   . Hypertension Mother   . Glaucoma Mother   . Cancer Mother        bladder cancer  . Heart disease Father   . Diabetes Sister   . Hypertension Sister   . Hyperlipidemia Brother   . Hypertension Brother   . Diabetes Sister   . Hypertension Sister     Social History   Tobacco Use  . Smoking status: Never Smoker  . Smokeless tobacco: Never Used  Substance Use Topics  . Alcohol use: Never  . Drug use: Never    Home Medications Prior to Admission medications   Medication Sig Start Date End Date Taking? Authorizing Provider  celecoxib (CELEBREX) 200 MG capsule Take 1 capsule (200 mg total) by mouth daily. 09/30/19   Shelda Pal, DO  cetirizine (ZYRTEC) 10 MG tablet Take 10 mg by mouth daily.  [provider]  cholecalciferol (VITAMIN D) 400 units TABS tablet Take 400 Units by mouth.    [provider]  metoprolol succinate (TOPROL-XL) 50 MG 24 hr tablet Take 1 tablet (50 mg total) by mouth daily. Take with or immediately following a meal. 04/03/20   Wendling, Crosby Oyster, DO  pantoprazole (PROTONIX) 20 MG tablet Take 1 tablet (20 mg total) by mouth daily. 04/03/20   Shelda Pal, DO  pravastatin (PRAVACHOL) 40 MG tablet Take 1 tablet (40 mg total) by mouth daily. 04/03/20   Shelda Pal, DO  Probiotic Product (PROBIOTIC-10 PO) Take by mouth.    [provider]    Allergies    Morphine and related, Amoxicillin, and Erythromycin  Review of Systems   Review of Systems  All other systems reviewed and are negative.   Physical Exam Updated Vital Signs BP (!) 202/83   Pulse 79   Temp 98.3 F (36.8 C) (Oral)   Resp 16   SpO2 98%   Physical Exam Vitals and nursing note reviewed.  Constitutional:      General: She is not in acute distress.    Appearance: She is well-developed.  HENT:     Head: Atraumatic.     Ears:     Comments: Left ear: Recent surgical site at the ear canal without signs of infection. Eyes:     Extraocular Movements: Extraocular movements intact.     Conjunctiva/sclera: Conjunctivae normal.     Pupils: Pupils are equal, round, and reactive to light.  Cardiovascular:     Rate and Rhythm: Normal rate and regular rhythm.     Pulses: Normal pulses.     Heart sounds: Normal heart sounds.  Pulmonary:     Effort: Pulmonary effort is normal.     Breath sounds: Normal breath sounds.  Abdominal:     Palpations: Abdomen is soft.     Tenderness: There is no abdominal tenderness.  Musculoskeletal:     Cervical back: Neck supple.  Skin:    Findings: No rash.  Neurological:     Mental Status: She is alert and oriented to person, place, and time.     GCS: GCS eye subscore is 4. GCS verbal subscore is 5. GCS motor subscore is 6.     Cranial Nerves: Cranial nerves are intact.     Sensory: Sensation is intact.     Motor: Motor function is intact.     Coordination: Coordination is intact.     Gait: Gait is intact.  Psychiatric:        Mood and Affect: Mood normal.     ED Results / Procedures / Treatments   Labs (all labs ordered are listed, but only abnormal results are displayed) Labs Reviewed  COMPREHENSIVE METABOLIC PANEL - Abnormal; Notable for the following components:      Result Value   Glucose, Bld 105 (*)    All other components within normal limits  TROPONIN I (HIGH SENSITIVITY) - Abnormal;  Notable for the following components:   Troponin I (High Sensitivity) 32 (*)    All other components within normal limits  TROPONIN I (HIGH SENSITIVITY) - Abnormal; Notable for the following components:   Troponin I (High Sensitivity) 34 (*)    All other components within normal limits  SARS CORONAVIRUS 2 BY RT PCR [facility] ORDER, [place] LAB)  CBC WITH DIFFERENTIAL/PLATELET  TSH    EKG EKG Interpretation  Date/Time:  Thursday May 07 2020 17:47:39  EDT Ventricular Rate:  73 PR Interval:    QRS Duration: 90 QT Interval:  389 QTC Calculation: 429 R Axis:   3 Text Interpretation: Sinus rhythm Consider anterior infarct 12 Lead; Mason-Likar no prior available for comparison Confirmed by Quintella Reichert [id number]) on 05/07/2020 7:18:21 PM   Radiology DG Chest Portable 1 View  Result Date: 05/07/2020 CLINICAL DATA:  Elevated troponins. EXAM: PORTABLE CHEST 1 VIEW COMPARISON:  None. FINDINGS: The heart size and mediastinal contours are within normal limits. Both lungs are clear. The visualized skeletal structures are unremarkable. IMPRESSION: No active disease. Electronically Signed   By: Constance Holster M.D.   On: 05/07/2020 19:55    Procedures Procedures (including critical care time)  Medications Ordered in ED Medications  labetalol (NORMODYNE) injection 20 mg (20 mg Intravenous Given 05/07/20 1740)    ED Course  I have reviewed the triage vital signs and the nursing notes.  Pertinent labs & imaging results that were available during my care of the patient were reviewed by me and considered in my medical decision making (see chart for details).    MDM Rules/Calculators/A&P                          BP (!) 235/96 (BP Location: Left Arm)   Pulse 86   Temp 98.3 F (36.8 C) (Oral)   Resp 18   SpO2 97%   Final Clinical Impression(s) / ED Diagnoses Final diagnoses:  Hypertensive urgency    Rx / DC Orders ED Discharge Orders    None     4:53  PM Patient here for evaluation of elevated blood pressure.  She is currently on metoprolol and has been compliant with her medication back for the past few days of blood pressure has been elevated with systolic greater than 604V.  She does not have any significant symptoms concerning for hypertensive emergency.  She was previously kept in the hospital as treatment of her hypertensive urgency and had her blood pressure controlled with IV antihypertensive.  At this time patient denies having any active chest pain or headache or any focal neuro deficit.  She did complain of some intense heartburn earlier this morning but states she has history of GERD.  7:36 PM EKG without concerning ischemic changes.  Troponin is elevated at 32.  Remainder of labs are reassuring.  In the setting of hypertension with elevated troponin, will have patient admitted for managements of hypertensive urgency/emergency.  Blood pressure did improve with labetalol 20 mg.  Her current blood pressure is 170/78.  She is currently without any symptoms.  Care discussed with Dr. Ralene Bathe.  8:00 PM Appreciate consultation from [facility] Dr. Sabino Gasser who agrees to see and admit pt.  He does request head CT scan.  Will order.  Pt is aware of plan.  Screening COVID-19 test ordered.    VIRJEAN BOMAN was evaluated in Emergency Department on 05/07/2020 for the symptoms described in the history of present illness. She was evaluated in the context of the global COVID-19 pandemic, which necessitated consideration that the patient might be at risk for infection with the SARS-CoV-2 virus that causes COVID-19. Institutional protocols and algorithms that pertain to the evaluation of patients at risk for COVID-19 are in a state of rapid change based on information released by regulatory bodies including the CDC and federal and state organizations. These policies and algorithms were followed during the patient's care in the ED.    Domenic Moras,  PA-C 05/07/20 2002    Quintella Reichert, MD 05/08/20 [phone]

## 2020-05-08 ENCOUNTER — Other Ambulatory Visit: Payer: Self-pay

## 2020-05-08 ENCOUNTER — Inpatient Hospital Stay (HOSPITAL_COMMUNITY): Payer: Medicare Other

## 2020-05-08 ENCOUNTER — Telehealth: Payer: Self-pay

## 2020-05-08 DIAGNOSIS — R7303 Prediabetes: Secondary | ICD-10-CM

## 2020-05-08 DIAGNOSIS — I160 Hypertensive urgency: Secondary | ICD-10-CM | POA: Diagnosis present

## 2020-05-08 LAB — BASIC METABOLIC PANEL
Anion gap: 10 (ref 5–15)
BUN: 18 mg/dL (ref 8–23)
CO2: 27 mmol/L (ref 22–32)
Calcium: 9.1 mg/dL (ref 8.9–10.3)
Chloride: 103 mmol/L (ref 98–111)
Creatinine, Ser: 0.84 mg/dL (ref 0.44–1.00)
GFR calc Af Amer: >60 mL/min (ref >60)
GFR calc non Af Amer: >60 mL/min (ref >60)
Glucose, Bld: 106 mg/dL — ABNORMAL HIGH (ref 70–99)
Potassium: 3.7 mmol/L (ref 3.5–5.1)
Sodium: 140 mmol/L (ref 135–145)

## 2020-05-08 LAB — MAGNESIUM: Magnesium: 2 mg/dL (ref 1.7–2.4)

## 2020-05-08 LAB — ECHOCARDIOGRAM COMPLETE
Area-P 1/2: 3.53 cm2
Height: 62 in
S' Lateral: 2.1 cm
Weight: 2256 oz

## 2020-05-08 LAB — CBC
HCT: 42.8 % (ref 36.0–46.0)
Hemoglobin: 13.3 g/dL (ref 12.0–15.0)
MCH: 28.2 pg (ref 26.0–34.0)
MCHC: 31.1 g/dL (ref 30.0–36.0)
MCV: 90.9 fL (ref 80.0–100.0)
Platelets: 295 10*3/uL (ref 150–400)
RBC: 4.71 MIL/uL (ref 3.87–5.11)
RDW: 12.2 % (ref 11.5–15.5)
WBC: 8.2 10*3/uL (ref 4.0–10.5)
nRBC: 0 % (ref 0.0–0.2)

## 2020-05-08 MED ORDER — METOPROLOL SUCCINATE ER 50 MG PO TB24
50.0000 mg | ORAL_TABLET | Freq: Every day | ORAL | Status: DC
  Administered 2020-05-08 – 2020-05-09 (×2): 50 mg via ORAL
  Filled 2020-05-08 (×2): qty 1

## 2020-05-08 NOTE — Hospital Course (Signed)
Ms. Traub is a 69 yo female with PMH HTN (controlled historically on Toprol), HLD, prediabetes, GERD who presented to the ER with severely elevated blood pressure upon referral from her PCP.  She had recently undergone ear surgery and was having elevated blood pressure prior to surgery which is abnormal for her and is typically controlled with just Toprol at home.  She underwent the surgery then required admission to [facility] for hypertensive urgency.  She was hospitalized from 05/05/2020 until 05/06/2020.  Blood pressure normalized with monitoring overnight and she was discharged home on her Toprol with no medication changes.  Upon returning home, she states that today her blood pressure was again elevated in the 258N systolically.  She denied any headache, vision changes, chest pain, shortness of breath.  She did have some nausea and vomiting associated with her surgery but not due to her elevated blood pressure she says.  Blood pressure in the ER initially was 202/83 followed by 235/96.  She responded initially to labetalol 20 mg IV however blood pressure quickly rebounded. She was treated with further labetalol IV with good response, however is admitted to the stepdown unit in case of need for initiation of Cardene infusion for adequate blood pressure control. EKG in the ER was negative for ST/T wave changes. Initial troponin was 32 followed by repeat 34. Patient denied CP nor SOB.   She is admitted for trop trending, echo in am, and further BP control.

## 2020-05-08 NOTE — Assessment & Plan Note (Signed)
-  LDL 105 on December 2020 labs.  She is on pravastatin at home.  Given prediabetes, could considering transitioning her to high intensity versus further lifestyle modifications -Resume statin at discharge

## 2020-05-08 NOTE — ED Notes (Signed)
ED TO INPATIENT HANDOFF REPORT  Name/Age/Gender Kelsey Stanley 69 y.o. female  Code Status    Code Status Orders  (From admission, onward)         Start     Ordered   05/07/20 2321  Full code  Continuous        05/07/20 2320        Code Status History    This patient has a current code status but no historical code status.   Advance Care Planning Activity      Home/SNF/Other Home  Chief Complaint Hypertensive urgency [I16.0] Hypertensive urgency, malignant [I16.0]  Level of Care/Admitting Diagnosis ED Disposition    ED Disposition Condition Comment   Admit  Hospital Area: [place] [[id number]]  Level of Care: Telemetry [5]  Admit to tele based on following criteria: Monitor for Ischemic changes  Covid Evaluation: Confirmed COVID Negative  Diagnosis: Hypertensive urgency, malignant [366294]  Admitting Physician: Caren Griffins [phone]  Attending Physician: Caren Griffins [phone]  Estimated length of stay: past midnight tomorrow  Certification:: I certify this patient will need inpatient services for at least 2 midnights       Medical History Past Medical History:  Diagnosis Date  . GERD (gastroesophageal reflux disease)   . Hyperlipidemia   . Hypertension     Allergies Allergies  Allergen Reactions  . Morphine And Related Other (See Comments)    Unable to communicate  . Amoxicillin Rash  . Erythromycin Rash    IV Location/Drains/Wounds Patient Lines/Drains/Airways Status    Active Line/Drains/Airways    Name Placement date Placement time Site Days   Peripheral IV 05/07/20 Right Antecubital 05/07/20  1736  Antecubital  1          Labs/Imaging Results for orders placed or performed during the hospital encounter of 05/07/20 (from the past 48 hour(s))  CBC with Differential/Platelet     Status: None   Collection Time: 05/07/20  5:45 PM  Result Value Ref Range   WBC 9.9 4.0 - 10.5 K/uL   RBC 4.87 3.87 - 5.11 MIL/uL    Hemoglobin 13.6 12.0 - 15.0 g/dL   HCT 42.8 36 - 46 %   MCV 87.9 80.0 - 100.0 fL   MCH 27.9 26.0 - 34.0 pg   MCHC 31.8 30.0 - 36.0 g/dL   RDW 12.4 11.5 - 15.5 %   Platelets 331 150 - 400 K/uL   nRBC 0.0 0.0 - 0.2 %   Neutrophils Relative % 64 %   Neutro Abs 6.3 1.7 - 7.7 K/uL   Lymphocytes Relative 27 %   Lymphs Abs 2.7 0.7 - 4.0 K/uL   Monocytes Relative 8 %   Monocytes Absolute 0.8 0 - 1 K/uL   Eosinophils Relative 1 %   Eosinophils Absolute 0.1 0 - 0 K/uL   Basophils Relative 0 %   Basophils Absolute 0.0 0 - 0 K/uL   Immature Granulocytes 0 %   Abs Immature Granulocytes 0.03 0.00 - 0.07 K/uL    Comment: Performed at [facility], [place] [address] [place], [place] [address]  Comprehensive metabolic panel     Status: Abnormal   Collection Time: 05/07/20  5:45 PM  Result Value Ref Range   Sodium 142 135 - 145 mmol/L   Potassium 3.8 3.5 - 5.1 mmol/L   Chloride 105 98 - 111 mmol/L   CO2 27 22 - 32 mmol/L   Glucose, Bld 105 (H) 70 - 99 mg/dL  Comment: Glucose reference range applies only to samples taken after fasting for at least 8 hours.   BUN 19 8 - 23 mg/dL   Creatinine, Ser 0.88 0.44 - 1.00 mg/dL   Calcium 9.4 8.9 - 10.3 mg/dL   Total Protein 7.5 6.5 - 8.1 g/dL   Albumin 4.3 3.5 - 5.0 g/dL   AST 23 15 - 41 U/L   ALT 21 0 - 44 U/L   Alkaline Phosphatase 61 38 - 126 U/L   Total Bilirubin 0.6 0.3 - 1.2 mg/dL   GFR calc non Af Amer >60 >60 mL/min   GFR calc Af Amer >60 >60 mL/min   Anion gap 10 5 - 15    Comment: Performed at [facility], [place] [address] [place], [place] [address]  Troponin I (High Sensitivity)     Status: Abnormal   Collection Time: 05/07/20  5:45 PM  Result Value Ref Range   Troponin I (High Sensitivity) 32 (H) <18 ng/L    Comment: (NOTE) Elevated high sensitivity troponin I (hsTnI) values and significant  changes across serial measurements may suggest ACS but many other  chronic and acute conditions are  known to elevate hsTnI results.  Refer to the "Links" section for chest pain algorithms and additional  guidance. Performed at [facility], [place] [address], [place], [place] [address]   Troponin I (High Sensitivity)     Status: Abnormal   Collection Time: 05/07/20  7:20 PM  Result Value Ref Range   Troponin I (High Sensitivity) 34 (H) <18 ng/L    Comment: (NOTE) Elevated high sensitivity troponin I (hsTnI) values and significant  changes across serial measurements may suggest ACS but many other  chronic and acute conditions are known to elevate hsTnI results.  Refer to the "Links" section for chest pain algorithms and additional  guidance. Performed at [facility], [place] [address] [place], [place] [address]   SARS Coronavirus 2 by RT PCR (hospital order, performed in [facility] hospital lab) Nasopharyngeal Nasopharyngeal Swab     Status: None   Collection Time: 05/07/20  7:36 PM   Specimen: Nasopharyngeal Swab  Result Value Ref Range   SARS Coronavirus 2 NEGATIVE NEGATIVE    Comment: (NOTE) SARS-CoV-2 target nucleic acids are NOT DETECTED.  The SARS-CoV-2 RNA is generally detectable in upper and lower respiratory specimens during the acute phase of infection. The lowest concentration of SARS-CoV-2 viral copies this assay can detect is 250 copies / mL. A negative result does not preclude SARS-CoV-2 infection and should not be used as the sole basis for treatment or other patient management decisions.  A negative result may occur with improper specimen collection / handling, submission of specimen other than nasopharyngeal swab, presence of viral mutation(s) within the areas targeted by this assay, and inadequate number of viral copies (<250 copies / mL). A negative result must be combined with clinical observations, patient history, and epidemiological information.  Fact Sheet for Patients:    StrictlyIdeas.no  Fact Sheet for Healthcare Providers: BankingDealers.co.za  This test is not yet approved or  cleared by the [place] FDA and has been authorized for detection and/or diagnosis of SARS-CoV-2 by FDA under an Emergency Use Authorization (EUA).  This EUA will remain in effect (meaning this test can be used) for the duration of the COVID-19 declaration under Section 564(b)(1) of the Act, 21 U.S.C. section 360bbb-3(b)(1), unless the authorization is terminated or revoked sooner.  Performed at [facility], [place] [address], [place], [place] [address]   Basic metabolic panel     Status: Abnormal   Collection Time: 05/08/20  4:42 AM  Result Value Ref Range   Sodium 140 135 - 145 mmol/L   Potassium 3.7 3.5 - 5.1 mmol/L   Chloride 103 98 - 111 mmol/L   CO2 27 22 - 32 mmol/L   Glucose, Bld 106 (H) 70 - 99 mg/dL    Comment: Glucose reference range applies only to samples taken after fasting for at least 8 hours.   BUN 18 8 - 23 mg/dL   Creatinine, Ser 0.84 0.44 - 1.00 mg/dL   Calcium 9.1 8.9 - 10.3 mg/dL   GFR calc non Af Amer >60 >60 mL/min   GFR calc Af Amer >60 >60 mL/min   Anion gap 10 5 - 15    Comment: Performed at [facility], [place] [address] [place], [place] [address]  CBC     Status: None   Collection Time: 05/08/20  4:42 AM  Result Value Ref Range   WBC 8.2 4.0 - 10.5 K/uL   RBC 4.71 3.87 - 5.11 MIL/uL   Hemoglobin 13.3 12.0 - 15.0 g/dL   HCT 42.8 36 - 46 %   MCV 90.9 80.0 - 100.0 fL   MCH 28.2 26.0 - 34.0 pg   MCHC 31.1 30.0 - 36.0 g/dL   RDW 12.2 11.5 - 15.5 %   Platelets 295 150 - 400 K/uL   nRBC 0.0 0.0 - 0.2 %    Comment: Performed at [facility], [place] [address], [place], [place] [address]  Magnesium     Status: None   Collection Time: 05/08/20  4:42 AM  Result Value Ref Range   Magnesium 2.0 1.7 - 2.4 mg/dL    Comment: Performed at  [facility], [place] [address] [place], [place] [address]   CT Head Wo Contrast  Result Date: 05/07/2020 CLINICAL DATA:  Hypertension EXAM: CT HEAD WITHOUT CONTRAST TECHNIQUE: Contiguous axial images were obtained from the base of the skull through the vertex without intravenous contrast. COMPARISON:  None. FINDINGS: Brain: There is no acute intracranial hemorrhage, mass effect, or edema. Gray-white differentiation is preserved. There is no extra-axial fluid collection. Ventricles and sulci are within normal limits in size and configuration. Vascular: There is minor atherosclerotic calcification at the skull base. Skull: Calvarium is unremarkable. Sinuses/Orbits: No acute finding. Other: None. IMPRESSION: No acute intracranial abnormality. Electronically Signed   By: Macy Mis M.D.   On: 05/07/2020 20:48   DG Chest Portable 1 View  Result Date: 05/07/2020 CLINICAL DATA:  Elevated troponins. EXAM: PORTABLE CHEST 1 VIEW COMPARISON:  None. FINDINGS: The heart size and mediastinal contours are within normal limits. Both lungs are clear. The visualized skeletal structures are unremarkable. IMPRESSION: No active disease. Electronically Signed   By: Constance Holster M.D.   On: 05/07/2020 19:55    Pending Labs Unresulted Labs (From admission, onward) Comment          Start     Ordered   05/14/20 0500  Creatinine, serum  (enoxaparin (LOVENOX)    CrCl >/= 30 ml/min)  Weekly,   R     Comments: while on enoxaparin therapy    05/07/20 2320   05/07/20 2001  TSH  Add-on,   AD        05/07/20 2000          Vitals/Pain Today's Vitals   05/08/20 1345  05/08/20 1445 05/08/20 1515 05/08/20 1600  BP: (!) 138/64 (!) 121/64 (!) 117/56 (!) 131/66  Pulse: 69 65 65 64  Resp: 19 19 19 18   Temp:      TempSrc:      SpO2: 97% 98% 93% 97%  Weight:      Height:      PainSc:        Isolation Precautions No active isolations  Medications Medications  enoxaparin (LOVENOX) injection  40 mg (40 mg Subcutaneous Given 05/08/20 0934)  labetalol (NORMODYNE) injection 10 mg (has no administration in time range)  sodium chloride flush (NS) 0.9 % injection 3 mL (3 mLs Intravenous Given 05/08/20 0936)  acetaminophen (TYLENOL) tablet 650 mg (has no administration in time range)  lisinopril (ZESTRIL) tablet 40 mg (40 mg Oral Given 05/08/20 0934)  metoprolol succinate (TOPROL-XL) 24 hr tablet 50 mg (50 mg Oral Given 05/08/20 0934)  labetalol (NORMODYNE) injection 20 mg (20 mg Intravenous Given 05/07/20 1740)  labetalol (NORMODYNE) injection 10 mg (10 mg Intravenous Given 05/07/20 2147)    Mobility walks

## 2020-05-08 NOTE — ED Notes (Signed)
Assumed care of patient at this time, nad noted, sr up x2, bed locked and low, call bell w/I reach.  Will continue to monitor.  Husband at the bedside.  Awaiting room assignment to the floor.

## 2020-05-08 NOTE — H&P (Signed)
History and Physical    Kelsey Stanley  INO:[id number]  DOB: 07/16/1951  DOA: 05/07/2020  PCP: Kelsey Pal, DO Patient coming from: home  Chief Complaint: Sent from PCP office for severely elevated BP  HPI:  Kelsey Stanley is a 69 yo female with PMH HTN (controlled historically on Toprol), HLD, prediabetes, GERD who presented to the ER with severely elevated blood pressure upon referral from her PCP.  She had recently undergone ear surgery and was having elevated blood pressure prior to surgery which is abnormal for her and is typically controlled with just Toprol at home.  She underwent the surgery then required admission to [facility] for hypertensive urgency.  She was hospitalized from 05/05/2020 until 05/06/2020.  Blood pressure normalized with monitoring overnight and she was discharged home on her Toprol with no medication changes.  Upon returning home, she states that today her blood pressure was again elevated in the 096G systolically.  She denied any headache, vision changes, chest pain, shortness of breath.  She did have some nausea and vomiting associated with her surgery but not due to her elevated blood pressure she says.  Blood pressure in the ER initially was 202/83 followed by 235/96.  She responded initially to labetalol 20 mg IV however blood pressure quickly rebounded. She was treated with further labetalol IV with good response, however is admitted to the stepdown unit in case of need for initiation of Cardene infusion for adequate blood pressure control. EKG in the ER was negative for ST/T wave changes. Initial troponin was 32 followed by repeat 34. Patient denied CP nor SOB.   She is admitted for trop trending, echo in am, and further BP control.    I have personally briefly reviewed patient's old medical records in [facility] and discussed patient with the ER provider when appropriate/indicated.  Assessment/Plan: Prediabetes - A1c 6.1% on  10/04/2019 - continue diet control  Hyperlipidemia -LDL 105 on December 2020 labs.  She is on pravastatin at home.  Given prediabetes, could considering transitioning her to high intensity versus further lifestyle modifications -Resume statin at discharge  Hypertensive urgency -No focal neuro deficits on exam on admission.  EKG without ischemic changes.  Indeterminate troponin likely due to severely elevated blood pressure -Check TSH -Repeat EKG if develops chest pain -Continue trending troponin overnight -Given longstanding history of hypertension, will obtain echo as she has not had 1 prior -Use labetalol as needed for now.  If refractory, will initiate on Cardene drip.  She states hydralazine given at [facility] caused severe tachycardia (likely reflex tachycardia, not unexpected, but will hold on administering for now) -DC Toprol and will start her on lisinopril.  If second agent needed, will then pursue CCB such as amlodipine   Code Status: Full DVT Prophylaxis:enoxaparin (Lovenox) 40mg  SQ 2 hours prior to surgery then every day Anticipated disposition is to home  History: Past Medical History:  Diagnosis Date  . GERD (gastroesophageal reflux disease)   . Hyperlipidemia   . Hypertension     Past Surgical History:  Procedure Laterality Date  . ABDOMINAL HYSTERECTOMY  1993  . BREAST BIOPSY Right 1994  . CHOLECYSTECTOMY, LAPAROSCOPIC  1993     reports that she has never smoked. She has never used smokeless tobacco. She reports that she does not drink alcohol and does not use drugs.  Allergies  Allergen Reactions  . Morphine And Related Other (See Comments)    Unable to communicate  . Amoxicillin Rash  .  Erythromycin Rash    Family History  Problem Relation Age of Onset  . Diabetes Mother   . Hyperlipidemia Mother   . Hypertension Mother   . Glaucoma Mother   . Cancer Mother        bladder cancer  . Heart disease Father   . Diabetes Sister   . Hypertension Sister    . Hyperlipidemia Brother   . Hypertension Brother   . Diabetes Sister   . Hypertension Sister    Home Medications: Prior to Admission medications   Medication Sig Start Date End Date Taking? Authorizing Provider  celecoxib (CELEBREX) 200 MG capsule Take 1 capsule (200 mg total) by mouth daily. Patient taking differently: Take 200 mg by mouth daily as needed for moderate pain.  09/30/19  Yes Kelsey Pal, DO  cetirizine (ZYRTEC) 10 MG tablet Take 10 mg by mouth daily.   Yes [provider]  cholecalciferol (VITAMIN D) 400 units TABS tablet Take 400 Units by mouth daily.    Yes [provider]  loratadine (CLARITIN) 10 MG tablet Take 10 mg by mouth daily as needed for allergies.   Yes [provider]  metoprolol succinate (TOPROL-XL) 50 MG 24 hr tablet Take 1 tablet (50 mg total) by mouth daily. Take with or immediately following a meal. 04/03/20  Yes Wendling, Crosby Oyster, DO  pantoprazole (PROTONIX) 20 MG tablet Take 1 tablet (20 mg total) by mouth daily. 04/03/20  Yes Kelsey Pal, DO  pravastatin (PRAVACHOL) 40 MG tablet Take 1 tablet (40 mg total) by mouth daily. 04/03/20  Yes Kelsey Pal, DO  Probiotic Product (PROBIOTIC-10 PO) Take 1 capsule by mouth daily.    Yes [provider]    Review of Systems:  Pertinent items noted in HPI and remainder of comprehensive ROS otherwise negative.  Physical Exam: Vitals:   05/07/20 1930 05/07/20 2100 05/07/20 2200 05/07/20 2254  BP: (!) 181/77 (!) 228/85 (!) 157/65 (!) 160/79  Pulse: 73 91 69 70  Resp: 16 20 19 18   Temp:      TempSrc:      SpO2: 100% 98% 95% 97%   General appearance: alert, cooperative and no distress Head: Normocephalic, without obvious abnormality, atraumatic Eyes: EOMI Lungs: clear to auscultation bilaterally Heart: regular rate and rhythm, S1, S2 normal and No murmur appreciated Abdomen: normal findings: bowel sounds normal and soft,  non-tender Extremities: No edema Skin: mobility and turgor normal Neurologic: Grossly normal  Labs on Admission:  I have personally reviewed following labs and imaging studies Results for orders placed or performed during the hospital encounter of 05/07/20 (from the past 24 hour(s))  CBC with Differential/Platelet     Status: None   Collection Time: 05/07/20  5:45 PM  Result Value Ref Range   WBC 9.9 4.0 - 10.5 K/uL   RBC 4.87 3.87 - 5.11 MIL/uL   Hemoglobin 13.6 12.0 - 15.0 g/dL   HCT 42.8 36 - 46 %   MCV 87.9 80.0 - 100.0 fL   MCH 27.9 26.0 - 34.0 pg   MCHC 31.8 30.0 - 36.0 g/dL   RDW 12.4 11.5 - 15.5 %   Platelets 331 150 - 400 K/uL   nRBC 0.0 0.0 - 0.2 %   Neutrophils Relative % 64 %   Neutro Abs 6.3 1.7 - 7.7 K/uL   Lymphocytes Relative 27 %   Lymphs Abs 2.7 0.7 - 4.0 K/uL   Monocytes Relative 8 %   Monocytes Absolute 0.8 0 -  1 K/uL   Eosinophils Relative 1 %   Eosinophils Absolute 0.1 0 - 0 K/uL   Basophils Relative 0 %   Basophils Absolute 0.0 0 - 0 K/uL   Immature Granulocytes 0 %   Abs Immature Granulocytes 0.03 0.00 - 0.07 K/uL  Comprehensive metabolic panel     Status: Abnormal   Collection Time: 05/07/20  5:45 PM  Result Value Ref Range   Sodium 142 135 - 145 mmol/L   Potassium 3.8 3.5 - 5.1 mmol/L   Chloride 105 98 - 111 mmol/L   CO2 27 22 - 32 mmol/L   Glucose, Bld 105 (H) 70 - 99 mg/dL   BUN 19 8 - 23 mg/dL   Creatinine, Ser 0.88 0.44 - 1.00 mg/dL   Calcium 9.4 8.9 - 10.3 mg/dL   Total Protein 7.5 6.5 - 8.1 g/dL   Albumin 4.3 3.5 - 5.0 g/dL   AST 23 15 - 41 U/L   ALT 21 0 - 44 U/L   Alkaline Phosphatase 61 38 - 126 U/L   Total Bilirubin 0.6 0.3 - 1.2 mg/dL   GFR calc non Af Amer >60 >60 mL/min   GFR calc Af Amer >60 >60 mL/min   Anion gap 10 5 - 15  Troponin I (High Sensitivity)     Status: Abnormal   Collection Time: 05/07/20  5:45 PM  Result Value Ref Range   Troponin I (High Sensitivity) 32 (H) <18 ng/L  Troponin I (High Sensitivity)      Status: Abnormal   Collection Time: 05/07/20  7:20 PM  Result Value Ref Range   Troponin I (High Sensitivity) 34 (H) <18 ng/L  SARS Coronavirus 2 by RT PCR (hospital order, performed in [place] hospital lab) Nasopharyngeal Nasopharyngeal Swab     Status: None   Collection Time: 05/07/20  7:36 PM   Specimen: Nasopharyngeal Swab  Result Value Ref Range   SARS Coronavirus 2 NEGATIVE NEGATIVE     Radiological Exams on Admission: CT Head Wo Contrast  Result Date: 05/07/2020 CLINICAL DATA:  Hypertension EXAM: CT HEAD WITHOUT CONTRAST TECHNIQUE: Contiguous axial images were obtained from the base of the skull through the vertex without intravenous contrast. COMPARISON:  None. FINDINGS: Brain: There is no acute intracranial hemorrhage, mass effect, or edema. Gray-white differentiation is preserved. There is no extra-axial fluid collection. Ventricles and sulci are within normal limits in size and configuration. Vascular: There is minor atherosclerotic calcification at the skull base. Skull: Calvarium is unremarkable. Sinuses/Orbits: No acute finding. Other: None. IMPRESSION: No acute intracranial abnormality. Electronically Signed   By: Macy Mis M.D.   On: 05/07/2020 20:48   DG Chest Portable 1 View  Result Date: 05/07/2020 CLINICAL DATA:  Elevated troponins. EXAM: PORTABLE CHEST 1 VIEW COMPARISON:  None. FINDINGS: The heart size and mediastinal contours are within normal limits. Both lungs are clear. The visualized skeletal structures are unremarkable. IMPRESSION: No active disease. Electronically Signed   By: Constance Holster M.D.   On: 05/07/2020 19:55   CT Head Wo Contrast  Final Result    DG Chest Portable 1 View  Final Result      Consults called:  N/A  EKG: Independently reviewed. Normal sinus rhythm.  No ST or T wave changes/no obvious ischemia.  No LVH   Dwyane Dee, MD [facility] Pager: Secure chat  If 7PM-7AM, please contact  night-coverage [email or web address] Use universal [place] password for that web site. If you do not have the password, please call the  hospital operator.  05/08/2020, 12:19 AM

## 2020-05-08 NOTE — Assessment & Plan Note (Addendum)
-  No focal neuro deficits on exam on admission.  EKG without ischemic changes.  Indeterminate troponin likely due to severely elevated blood pressure -Check TSH -Repeat EKG if develops chest pain -Continue trending troponin overnight -Given longstanding history of hypertension, will obtain echo as she has not had 1 prior -Use labetalol as needed for now.  If refractory, will initiate on Cardene drip.  She states hydralazine given at [facility] caused severe tachycardia (likely reflex tachycardia, not unexpected, but will hold on administering for now) -DC Toprol and will start her on lisinopril.  If second agent needed, will then pursue CCB such as amlodipine

## 2020-05-08 NOTE — Progress Notes (Signed)
*  PRELIMINARY RESULTS* Echocardiogram 2D Echocardiogram has been performed.  Kelsey Stanley 05/08/2020, 1:55 PM

## 2020-05-08 NOTE — Telephone Encounter (Signed)
Nurse Assessment Nurse: Ronnald Ramp, RN, [place] Date/Time (Eastern Time): 05/07/2020 12:41:13 PM Confirm and document reason for call. If symptomatic, describe symptoms. ---Caller states she had surgery on her left ear on Tuesday. She had to spend the night in the hospital because of high blood pressure. She was discharged yesterday. Her BP is 200/101 HR 80, and she can feel her heart beating in her ears. Has the patient had close contact with a person known or suspected to have the novel coronavirus illness OR traveled / lives in area with major community spread (including international travel) in the last 14 days from the onset of symptoms? * If Asymptomatic, screen for exposure and travel within the last 14 days. ---No Does the patient have any new or worsening symptoms? ---Yes Will a triage be completed? ---Yes Related visit to physician within the last 2 weeks? ---Yes Does the PT have any chronic conditions? (i.e. diabetes, asthma, this includes High risk factors for pregnancy, etc.) ---Yes List chronic conditions. ---HTN, Is this a behavioral health or substance abuse call? ---No Guidelines Guideline Title Affirmed Question Affirmed Notes Nurse Date/Time (Eastern Time) Blood Pressure - High [5] Systolic BP >= 701 OR Diastolic >= 779 AND [3] having NO cardiac or neurologic symptoms Ronnald Ramp, RN, Miranda 05/07/2020 12:43:00 PMPLEASE NOTE: All timestamps contained within this report are represented as [place] Standard Time. CONFIDENTIALTY NOTICE: This fax transmission is intended only for the addressee. It contains information that is legally privileged, confidential or otherwise protected from use or disclosure. If you are not the intended recipient, you are strictly prohibited from reviewing, disclosing, copying using or disseminating any of this information or taking any action in reliance on or regarding this information. If you have received this fax in error, please notify us  immediately by telephone so that we can arrange for its return to [place]. Phone: [phone], Toll-Free: [phone], Fax: [phone] Page: 2 of 2 Call Id: [id number] [place]. Time Eilene Ghazi Time) Disposition Final User 05/07/2020 12:38:21 PM Send to Urgent [place], [place] 05/07/2020 12:48:02 PM See HCP within 4 Hours (or PCP triage) Yes Ronnald Ramp, RN, [place] Caller Disagree/Comply Comply Caller Understands Yes PreDisposition Call Doctor Care Advice Given Per Guideline SEE HCP WITHIN 4 HOURS (OR PCP TRIAGE): * IF OFFICE WILL BE OPEN: You need to be seen within the next 3 or 4 hours. Call your doctor (or NP/PA) now or as soon as the office opens. * ED: Patients who may need surgery or hospital admission need to be sent to an ED. So do most patients with serious symptoms or complex medical problems. CARE ADVICE given per High Blood Pressure (Adult) guideline. CALL BACK IF: * Weakness or numbness of the face, arm or leg on one side of the body occurs * Difficulty walking, difficulty talking, or severe headache occurs * Chest pain or difficulty breathing occurs * You become worse. Referrals GO TO FACILITY UNDECIDED   Pt currently at ED.

## 2020-05-08 NOTE — ED Notes (Signed)
Family updated as to patient's status.

## 2020-05-08 NOTE — Assessment & Plan Note (Addendum)
-   A1c 6.1% on 10/04/2019 - continue diet control

## 2020-05-08 NOTE — Progress Notes (Addendum)
No charge note, seen and examined this morning, admitted overnight with hypertensive urgency.  Blood pressure in the 170s this morning, she is asymptomatic other than a "whooshing sound" in her left ear which she has had all her life but when the blood pressure goes up it is louder.  Added lisinopril to her home Toprol.  Monitor trends today.  Also reported bright red blood coating the stool, has a history of internal and external hemorrhoids.  Hemoglobin is 13, and stable.  Monitor  Re-evaluated patient in pm. She had another bloody BM. Suspect hemorrhoids however would like to monitor overnight and recheck Hb in am. Her BP seems better after Lisinopril and Metoprolol, systolic in the 276R when I was in the room. 2D echo pending   Maddyson Keil M. Cruzita Lederer, MD, PhD [facility]  Between 7 am - 7 pm you can contact me via [place] or [place].  I am not available 7 pm - 7 am, please contact night coverage MD/APP via Amion

## 2020-05-09 LAB — TSH: TSH: 1.219 u[IU]/mL (ref 0.350–4.500)

## 2020-05-09 LAB — TROPONIN I (HIGH SENSITIVITY)
Troponin I (High Sensitivity): 16 ng/L (ref <18)
Troponin I (High Sensitivity): 11 ng/L (ref <18)

## 2020-05-09 MED ORDER — LISINOPRIL 20 MG PO TABS: 20.0000 mg | ORAL_TABLET | Freq: Every day | ORAL | 0 refills | Status: DC

## 2020-05-09 NOTE — Progress Notes (Signed)
Patient discharged home. IV removed - WNL.  Reviewed AVS and medications - instructed patient to adhere to a low sodium diet, educational handouts provided.  Instructed to follow up with PCP in 1 week. Patient verbalizes understanding.  No questions at this time, patient assisted off unit in NAD via [place] with RN.Marland Kitchen

## 2020-05-09 NOTE — Discharge Instructions (Signed)
Lisinopril Tablets What is this medicine? LISINOPRIL (lyse [place] oh pril) is an ACE inhibitor. It treats high blood pressure and heart failure. It can treat heart damage after a heart attack. This medicine may be used for other purposes; ask your health care provider or pharmacist if you have questions. COMMON BRAND NAME(S): Prinivil, Zestril What should I tell my health care provider before I take this medicine? They need to know if you have any of these conditions:  diabetes  heart or blood vessel disease  kidney disease  low blood pressure  previous swelling of the tongue, face, or lips with difficulty breathing, difficulty swallowing, hoarseness, or tightening of the throat  an unusual or allergic reaction to lisinopril, other ACE inhibitors, insect venom, foods, dyes, or preservatives  pregnant or trying to get pregnant  breast-feeding How should I use this medicine? Take this drug by mouth. Take it as directed on the prescription label at the same time every day. You can take it with or without food. If it upsets your stomach, take it with food. Keep taking it unless your health care provider tells you to stop. Talk to your health care provider about the use of this drug in children. While it may be prescribed for children as young as 6 for selected conditions, precautions do apply. Overdosage: If you think you have taken too much of this medicine contact a poison control center or emergency room at once. NOTE: This medicine is only for you. Do not share this medicine with others. What if I miss a dose? If you miss a dose, take it as soon as you can. If it is almost time for your next dose, take only that dose. Do not take double or extra doses. What may interact with this medicine? Do not take this medicine with any of the following medications:  hymenoptera venom  sacubitril; valsartan This medicines may also interact with the following  medications:  aliskiren  angiotensin receptor blockers, like losartan or valsartan  certain medicines for diabetes  diuretics  everolimus  gold compounds  lithium  NSAIDs, medicines for pain and inflammation, like ibuprofen or naproxen  potassium salts or supplements  salt substitutes  sirolimus  temsirolimus This list may not describe all possible interactions. Give your health care provider a list of all the medicines, herbs, non-prescription drugs, or dietary supplements you use. Also tell them if you smoke, drink alcohol, or use illegal drugs. Some items may interact with your medicine. What should I watch for while using this medicine? Visit your doctor or health care professional for regular check ups. Check your blood pressure as directed. Ask your doctor what your blood pressure should be, and when you should contact him or her. Do not treat yourself for coughs, colds, or pain while you are using this medicine without asking your doctor or health care professional for advice. Some ingredients may increase your blood pressure. Women should inform their doctor if they wish to become pregnant or think they might be pregnant. There is a potential for serious side effects to an unborn child. Talk to your health care professional or pharmacist for more information. Check with your doctor or health care professional if you get an attack of severe diarrhea, nausea and vomiting, or if you sweat a lot. The loss of too much body fluid can make it dangerous for you to take this medicine. You may get drowsy or dizzy. Do not drive, use machinery, or do anything that needs mental   alertness until you know how this drug affects you. Do not stand or sit up quickly, especially if you are an older patient. This reduces the risk of dizzy or fainting spells. Alcohol can make you more drowsy and dizzy. Avoid alcoholic drinks. Avoid salt substitutes unless you are told otherwise by your doctor or  health care professional. What side effects may I notice from receiving this medicine? Side effects that you should report to your doctor or health care professional as soon as possible:  allergic reactions like skin rash, itching or hives, swelling of the hands, feet, face, lips, throat, or tongue  breathing problems  signs and symptoms of kidney injury like trouble passing urine or change in the amount of urine  signs and symptoms of increased potassium like muscle weakness; chest pain; or fast, irregular heartbeat  signs and symptoms of liver injury like dark yellow or brown urine; general ill feeling or flu-like symptoms; light-colored stools; loss of appetite; nausea; right upper belly pain; unusually weak or tired; yellowing of the eyes or skin  signs and symptoms of low blood pressure like dizziness; feeling faint or lightheaded, falls; unusually weak or tired  stomach pain with or without nausea and vomiting Side effects that usually do not require medical attention (report to your doctor or health care professional if they continue or are bothersome):  changes in taste  cough  dizziness  fever  headache  sensitivity to light This list may not describe all possible side effects. Call your doctor for medical advice about side effects. You may report side effects to FDA at 1-800-FDA-1088. Where should I keep my medicine? Keep out of the reach of children and pets. Store at room temperature between 20 and 25 degrees C (68 and 77 degrees F). Protect from moisture. Keep the container tightly closed. Do not freeze. Avoid exposure to extreme heat. Throw away any unused drug after the expiration date. NOTE: This sheet is a summary. It may not cover all possible information. If you have questions about this medicine, talk to your doctor, pharmacist, or health care provider.  2020 Elsevier/Gold Standard (2019-05-08 11:38:35)     Low-Sodium Eating Plan Sodium, which is an  element that makes up salt, helps you maintain a healthy balance of fluids in your body. Too much sodium can increase your blood pressure and cause fluid and waste to be held in your body. Your health care provider or dietitian may recommend following this plan if you have high blood pressure (hypertension), kidney disease, liver disease, or heart failure. Eating less sodium can help lower your blood pressure, reduce swelling, and protect your heart, liver, and kidneys. What are tips for following this plan? General guidelines  Most people on this plan should limit their sodium intake to 1,500-2,000 mg (milligrams) of sodium each day. Reading food labels   The Nutrition Facts label lists the amount of sodium in one serving of the food. If you eat more than one serving, you must multiply the listed amount of sodium by the number of servings.  Choose foods with less than 140 mg of sodium per serving.  Avoid foods with 300 mg of sodium or more per serving. Shopping  Look for lower-sodium products, often labeled as "low-sodium" or "no salt added."  Always check the sodium content even if foods are labeled as "unsalted" or "no salt added".  Buy fresh foods. ? Avoid canned foods and premade or frozen meals. ? Avoid canned, cured, or processed meats  Buy  breads that have less than 80 mg of sodium per slice. Cooking  Eat more home-cooked food and less restaurant, buffet, and fast food.  Avoid adding salt when cooking. Use salt-free seasonings or herbs instead of table salt or sea salt. Check with your health care provider or pharmacist before using salt substitutes.  Cook with plant-based oils, such as canola, sunflower, or olive oil. Meal planning  When eating at a restaurant, ask that your food be prepared with less salt or no salt, if possible.  Avoid foods that contain MSG (monosodium glutamate). MSG is sometimes added to [place] food, bouillon, and some canned foods. What foods are  recommended? The items listed may not be a complete list. Talk with your dietitian about what dietary choices are best for you. Grains Low-sodium cereals, including oats, puffed wheat and rice, and shredded wheat. Low-sodium crackers. Unsalted rice. Unsalted pasta. Low-sodium bread. Whole-grain breads and whole-grain pasta. Vegetables Fresh or frozen vegetables. "No salt added" canned vegetables. "No salt added" tomato sauce and paste. Low-sodium or reduced-sodium tomato and vegetable juice. Fruits Fresh, frozen, or canned fruit. Fruit juice. Meats and other protein foods Fresh or frozen (no salt added) meat, poultry, seafood, and fish. Low-sodium canned tuna and salmon. Unsalted nuts. Dried peas, beans, and lentils without added salt. Unsalted canned beans. Eggs. Unsalted nut butters. Dairy Milk. Soy milk. Cheese that is naturally low in sodium, such as ricotta cheese, fresh mozzarella, or Swiss cheese Low-sodium or reduced-sodium cheese. Cream cheese. Yogurt. Fats and oils Unsalted butter. Unsalted margarine with no trans fat. Vegetable oils such as canola or olive oils. Seasonings and other foods Fresh and dried herbs and spices. Salt-free seasonings. Low-sodium mustard and ketchup. Sodium-free salad dressing. Sodium-free light mayonnaise. Fresh or refrigerated horseradish. Lemon juice. Vinegar. Homemade, reduced-sodium, or low-sodium soups. Unsalted popcorn and pretzels. Low-salt or salt-free chips. What foods are not recommended? The items listed may not be a complete list. Talk with your dietitian about what dietary choices are best for you. Grains Instant hot cereals. Bread stuffing, pancake, and biscuit mixes. Croutons. Seasoned rice or pasta mixes. Noodle soup cups. Boxed or frozen macaroni and cheese. Regular salted crackers. Self-rising flour. Vegetables Sauerkraut, pickled vegetables, and relishes. Olives. [place] fries. Onion rings. Regular canned vegetables (not low-sodium or  reduced-sodium). Regular canned tomato sauce and paste (not low-sodium or reduced-sodium). Regular tomato and vegetable juice (not low-sodium or reduced-sodium). Frozen vegetables in sauces. Meats and other protein foods Meat or fish that is salted, canned, smoked, spiced, or pickled. Bacon, ham, sausage, hotdogs, corned beef, chipped beef, packaged lunch meats, salt pork, jerky, pickled herring, anchovies, regular canned tuna, sardines, salted nuts. Dairy Processed cheese and cheese spreads. Cheese curds. Blue cheese. Feta cheese. String cheese. Regular cottage cheese. Buttermilk. Canned milk. Fats and oils Salted butter. Regular margarine. Ghee. Bacon fat. Seasonings and other foods Onion salt, garlic salt, seasoned salt, table salt, and sea salt. Canned and packaged gravies. Worcestershire sauce. Tartar sauce. Barbecue sauce. Teriyaki sauce. Soy sauce, including reduced-sodium. Steak sauce. Fish sauce. Oyster sauce. Cocktail sauce. Horseradish that you find on the shelf. Regular ketchup and mustard. Meat flavorings and tenderizers. Bouillon cubes. Hot sauce and Tabasco sauce. Premade or packaged marinades. Premade or packaged taco seasonings. Relishes. Regular salad dressings. Salsa. Potato and tortilla chips. Corn chips and puffs. Salted popcorn and pretzels. Canned or dried soups. Pizza. Frozen entrees and pot pies. Summary  Eating less sodium can help lower your blood pressure, reduce swelling, and protect your heart, liver, and  kidneys.  Most people on this plan should limit their sodium intake to 1,500-2,000 mg (milligrams) of sodium each day.  Canned, boxed, and frozen foods are high in sodium. Restaurant foods, fast foods, and pizza are also very high in sodium. You also get sodium by adding salt to food.  Try to cook at home, eat more fresh fruits and vegetables, and eat less fast food, canned, processed, or prepared foods. This information is not intended to replace advice given to you  by your health care provider. Make sure you discuss any questions you have with your health care provider. Document Revised: 09/15/2017 Document Reviewed: 09/26/2016 Elsevier Patient Education  2020 [employer].

## 2020-05-09 NOTE — Discharge Summary (Signed)
Physician Discharge Summary  Kelsey Stanley XLK:[id number] DOB: 10/22/1950 DOA: 05/07/2020  PCP: Kelsey Pal, DO  Admit date: 05/07/2020 Discharge date: 05/09/2020  Time spent: 35 minutes  Recommendations for Outpatient Follow-up:  ENT follow-up PCP in 1 week, monitor log of blood pressure GI referral for screening colonoscopy  Discharge Diagnoses:  Active Problems:   Hyperlipidemia   Hypertensive urgency   Prediabetes   Hypertensive urgency Bright red blood per rectum   Discharge Condition: stable  Diet recommendation: Low-sodium, carb modified  Filed Weights   05/08/20 0456  Weight: 64 kg    History of present illness:  Kelsey Stanley is a 69 yo female with PMH HTN (controlled historically on Toprol), HLD, prediabetes, GERD who presented to the ER with severely elevated blood pressure upon referral from her PCP.  She had recently undergone ear surgery and was having elevated blood pressure . Blood pressure normalized with monitoring overnight and she was discharged home on her Toprol with no medication changes.  Upon returning home, she states that today her blood pressure was again elevated in the 366Y systolically after going home her blood pressure was in the 403K systolic, this was associated with a swishing sound in her ear, subsequently sent to the ED. Blood pressure in the ER initially was 202/83 followed by 235/96  Hospital Course:   Hypertensive urgency -Suspect this could be secondary to recent inner ear surgery -Required labetalol nicardipine drip in the emergency room -Subsequently blood pressure stabilized, lisinopril was added to home dose of Toprol -Blood pressures in the 120-1 60 range at this time -Advised to keep a log of her blood pressure and take this to her PCP -If her blood pressure rebounds, consider work-up for secondary causes of hypertension namely pheochromocytoma, hyperaldosteronism etc.  History of intermittent suspected  hemorrhoidal bleed -Needs to have an updated colonoscopy if she has not had that  Rest of her medical problems are stable  Discharge Exam: Vitals:   05/09/20 0202 05/09/20 0516  BP: 125/67 (!) 122/61  Pulse: 72 66  Resp: 14 14  Temp: 98.4 F (36.9 C) 97.8 F (36.6 C)  SpO2: 98% 98%    General: AAOx3 Cardiovascular: S1S2/RRR Respiratory: CTAB  Discharge Instructions   Discharge Instructions    Diet - low sodium heart healthy   Complete by: As directed    Increase activity slowly   Complete by: As directed      Allergies as of 05/09/2020      Reactions   Morphine And Related Other (See Comments)   Unable to communicate   Amoxicillin Rash   Erythromycin Rash      Medication List    STOP taking these medications   celecoxib 200 MG capsule Commonly known as: CELEBREX     TAKE these medications   cetirizine 10 MG tablet Commonly known as: ZYRTEC Take 10 mg by mouth daily.   cholecalciferol 10 MCG (400 UNIT) Tabs tablet Commonly known as: VITAMIN D3 Take 400 Units by mouth daily.   lisinopril 20 MG tablet Commonly known as: ZESTRIL Take 1 tablet (20 mg total) by mouth daily. Start taking on: May 10, 2020   loratadine 10 MG tablet Commonly known as: CLARITIN Take 10 mg by mouth daily as needed for allergies.   metoprolol succinate 50 MG 24 hr tablet Commonly known as: TOPROL-XL Take 1 tablet (50 mg total) by mouth daily. Take with or immediately following a meal.   pantoprazole 20 MG tablet Commonly known as: PROTONIX Take  1 tablet (20 mg total) by mouth daily.   pravastatin 40 MG tablet Commonly known as: PRAVACHOL Take 1 tablet (40 mg total) by mouth daily.   PROBIOTIC-10 PO Take 1 capsule by mouth daily.      Allergies  Allergen Reactions  . Morphine And Related Other (See Comments)    Unable to communicate  . Amoxicillin Rash  . Erythromycin Rash    Follow-up Information    Kelsey Pal, DO. Schedule an appointment as  soon as possible for a visit in 1 week(s).   Specialty: Family Medicine Contact information: [place] [address] [phone]                The results of significant diagnostics from this hospitalization (including imaging, microbiology, ancillary and laboratory) are listed below for reference.    Significant Diagnostic Studies: CT Head Wo Contrast  Result Date: 05/07/2020 CLINICAL DATA:  Hypertension EXAM: CT HEAD WITHOUT CONTRAST TECHNIQUE: Contiguous axial images were obtained from the base of the skull through the vertex without intravenous contrast. COMPARISON:  None. FINDINGS: Brain: There is no acute intracranial hemorrhage, mass effect, or edema. Gray-white differentiation is preserved. There is no extra-axial fluid collection. Ventricles and sulci are within normal limits in size and configuration. Vascular: There is minor atherosclerotic calcification at the skull base. Skull: Calvarium is unremarkable. Sinuses/Orbits: No acute finding. Other: None. IMPRESSION: No acute intracranial abnormality. Electronically Signed   By: Macy Mis M.D.   On: 05/07/2020 20:48   DG Chest Portable 1 View  Result Date: 05/07/2020 CLINICAL DATA:  Elevated troponins. EXAM: PORTABLE CHEST 1 VIEW COMPARISON:  None. FINDINGS: The heart size and mediastinal contours are within normal limits. Both lungs are clear. The visualized skeletal structures are unremarkable. IMPRESSION: No active disease. Electronically Signed   By: Constance Holster M.D.   On: 05/07/2020 19:55   ECHOCARDIOGRAM COMPLETE  Result Date: 05/08/2020    ECHOCARDIOGRAM REPORT   Patient Name:   Kelsey Stanley [facility] Date of Exam: 05/08/2020 Medical Rec #:  [id number]        Height:       62.0 in Accession #:    [id number]       Weight:       141.0 lb Date of Birth:  1951/03/15       BSA:          1.648 m Patient Age:    53 years         BP:           138/64 mmHg Patient Gender: F                HR:            69 bpm. Exam Location:  Inpatient Procedure: 2D Echo, Cardiac Doppler and Color Doppler Indications:    Elevated troponin  History:        Patient has no prior history of Echocardiogram examinations.                 Risk Factors:Hypertension, Diabetes and Dyslipidemia. Elevated                 troponin.  Sonographer:    Dustin Flock Referring Phys: [place]  1. Left ventricular ejection fraction, by estimation, is 60 to 65%. The left ventricle has normal function. The left ventricle has no regional wall motion abnormalities. Left ventricular diastolic parameters were normal.  2. Right ventricular  systolic function is normal. The right ventricular size is normal. Tricuspid regurgitation signal is inadequate for assessing PA pressure.  3. The mitral valve is normal in structure. Trivial mitral valve regurgitation. No evidence of mitral stenosis.  4. The aortic valve is grossly normal. Aortic valve regurgitation is not visualized. No aortic stenosis is present.  5. The inferior vena cava is normal in size with greater than 50% respiratory variability, suggesting right atrial pressure of 3 mmHg. Comparison(s): No prior Echocardiogram. Conclusion(s)/Recommendation(s): Normal biventricular function without evidence of hemodynamically significant valvular heart disease. FINDINGS  Left Ventricle: Left ventricular ejection fraction, by estimation, is 60 to 65%. The left ventricle has normal function. The left ventricle has no regional wall motion abnormalities. The left ventricular internal cavity size was normal in size. There is  no left ventricular hypertrophy. Left ventricular diastolic parameters were normal. Right Ventricle: The right ventricular size is normal. No increase in right ventricular wall thickness. Right ventricular systolic function is normal. Tricuspid regurgitation signal is inadequate for assessing PA pressure. Left Atrium: Left atrial size was normal in size. Right  Atrium: Right atrial size was normal in size. Pericardium: Trivial pericardial effusion is present. Mitral Valve: The mitral valve is normal in structure. Trivial mitral valve regurgitation. No evidence of mitral valve stenosis. Tricuspid Valve: The tricuspid valve is normal in structure. Tricuspid valve regurgitation is trivial. No evidence of tricuspid stenosis. Aortic Valve: The aortic valve is grossly normal. Aortic valve regurgitation is not visualized. No aortic stenosis is present. Pulmonic Valve: The pulmonic valve was not well visualized. Pulmonic valve regurgitation is not visualized. Aorta: The ascending aorta was not well visualized and the aortic root is normal in size and structure. Venous: The inferior vena cava is normal in size with greater than 50% respiratory variability, suggesting right atrial pressure of 3 mmHg. IAS/Shunts: No atrial level shunt detected by color flow Doppler.  LEFT VENTRICLE PLAX 2D LVIDd:         3.80 cm  Diastology LVIDs:         2.10 cm  LV e' lateral:   7.72 cm/s LV PW:         1.00 cm  LV E/e' lateral: 6.6 LV IVS:        1.00 cm  LV e' medial:    5.98 cm/s LVOT diam:     1.90 cm  LV E/e' medial:  8.5 LV SV:         71 LV SV Index:   43 LVOT Area:     2.84 cm  RIGHT VENTRICLE TAPSE (M-mode): 2.4 cm LEFT ATRIUM             Index       RIGHT ATRIUM           Index LA diam:        3.40 cm 2.06 cm/m  RA Area:     11.50 cm LA Vol (A2C):   24.3 ml 14.75 ml/m RA Volume:   24.30 ml  14.75 ml/m LA Vol (A4C):   39.8 ml 24.15 ml/m LA Biplane Vol: 33.5 ml 20.33 ml/m  AORTIC VALVE LVOT Vmax:   94.60 cm/s LVOT Vmean:  68.900 cm/s LVOT VTI:    0.252 m  AORTA Ao Root diam: 2.70 cm MITRAL VALVE MV Area (PHT): 3.53 cm    SHUNTS MV Decel Time: 215 msec    Systemic VTI:  0.25 m MV E velocity: 50.60 cm/s  Systemic Diam: 1.90 cm MV A velocity: 63.80  cm/s MV E/A ratio:  0.79 Buford Dresser MD Electronically signed by Buford Dresser MD Signature Date/Time: 05/08/2020/10:00:37  PM    Final     Microbiology: Recent Results (from the past 240 hour(s))  SARS Coronavirus 2 by RT PCR (hospital order, performed in [facility] hospital lab) Nasopharyngeal Nasopharyngeal Swab     Status: None   Collection Time: 05/07/20  7:36 PM   Specimen: Nasopharyngeal Swab  Result Value Ref Range Status   SARS Coronavirus 2 NEGATIVE NEGATIVE Final    Comment: (NOTE) SARS-CoV-2 target nucleic acids are NOT DETECTED.  The SARS-CoV-2 RNA is generally detectable in upper and lower respiratory specimens during the acute phase of infection. The lowest concentration of SARS-CoV-2 viral copies this assay can detect is 250 copies / mL. A negative result does not preclude SARS-CoV-2 infection and should not be used as the sole basis for treatment or other patient management decisions.  A negative result may occur with improper specimen collection / handling, submission of specimen other than nasopharyngeal swab, presence of viral mutation(s) within the areas targeted by this assay, and inadequate number of viral copies (<250 copies / mL). A negative result must be combined with clinical observations, patient history, and epidemiological information.  Fact Sheet for Patients:   StrictlyIdeas.no  Fact Sheet for Healthcare Providers: BankingDealers.co.za  This test is not yet approved or  cleared by the [place] FDA and has been authorized for detection and/or diagnosis of SARS-CoV-2 by FDA under an Emergency Use Authorization (EUA).  This EUA will remain in effect (meaning this test can be used) for the duration of the COVID-19 declaration under Section 564(b)(1) of the Act, 21 U.S.C. section 360bbb-3(b)(1), unless the authorization is terminated or revoked sooner.  Performed at [facility], [place] [address], [place], [place] [address]      Labs: Basic Metabolic Panel: Recent Labs  Lab 05/07/20 1745  05/08/20 0442  NA 142 140  K 3.8 3.7  CL 105 103  CO2 27 27  GLUCOSE 105* 106*  BUN 19 18  CREATININE 0.88 0.84  CALCIUM 9.4 9.1  MG  --  2.0   Liver Function Tests: Recent Labs  Lab 05/07/20 1745  AST 23  ALT 21  ALKPHOS 61  BILITOT 0.6  PROT 7.5  ALBUMIN 4.3   No results for input(s): LIPASE, AMYLASE in the last 168 hours. No results for input(s): AMMONIA in the last 168 hours. CBC: Recent Labs  Lab 05/07/20 1745 05/08/20 0442  WBC 9.9 8.2  NEUTROABS 6.3  --   HGB 13.6 13.3  HCT 42.8 42.8  MCV 87.9 90.9  PLT 331 295   Cardiac Enzymes: No results for input(s): CKTOTAL, CKMB, CKMBINDEX, TROPONINI in the last 168 hours. BNP: BNP (last 3 results) No results for input(s): BNP in the last 8760 hours.  ProBNP (last 3 results) No results for input(s): PROBNP in the last 8760 hours.  CBG: No results for input(s): GLUCAP in the last 168 hours.  Signed:  Domenic Polite MD.  [facility] 05/09/2020, 4:41 PM

## 2020-05-09 NOTE — Progress Notes (Addendum)
Patient SBP is  173 . No c/o of blurred vision etc. Gave PRN Labetalol IVP per order if SBP above 160. Will Reassessed patient BP. Will cont to monitor.    At 0000 on  7/24 reassessed BP with Dinamap - Patient resting. BP 152/64. PRN effective will continue to monitor BP.

## 2020-05-11 ENCOUNTER — Telehealth: Payer: Self-pay | Admitting: *Deleted

## 2020-05-11 NOTE — Telephone Encounter (Signed)
1st attempt. Unable to reach patient.   

## 2020-05-12 NOTE — Telephone Encounter (Signed)
I have made two attempts and have been unable to reach patient. Pt has hospital follow up scheduled w/ PCP 05/13/20.

## 2020-05-13 ENCOUNTER — Ambulatory Visit (INDEPENDENT_AMBULATORY_CARE_PROVIDER_SITE_OTHER): Payer: Medicare Other | Admitting: Family Medicine

## 2020-05-13 ENCOUNTER — Encounter: Payer: Self-pay | Admitting: Family Medicine

## 2020-05-13 ENCOUNTER — Other Ambulatory Visit: Payer: Self-pay

## 2020-05-13 VITALS — BP 144/80 | HR 103 | Temp 98.5°F | Ht 62.0 in | Wt 134.0 lb

## 2020-05-13 DIAGNOSIS — I10 Essential (primary) hypertension: Secondary | ICD-10-CM

## 2020-05-13 LAB — CBC
HCT: 40.8 % (ref 36.0–46.0)
Hemoglobin: 13.5 g/dL (ref 12.0–15.0)
MCHC: 33.2 g/dL (ref 30.0–36.0)
MCV: 85.6 fl (ref 78.0–100.0)
Platelets: 327 10*3/uL (ref 150.0–400.0)
RBC: 4.76 Mil/uL (ref 3.87–5.11)
RDW: 13.1 % (ref 11.5–15.5)
WBC: 6.9 10*3/uL (ref 4.0–10.5)

## 2020-05-13 LAB — COMPREHENSIVE METABOLIC PANEL
ALT: 17 U/L (ref 0–35)
AST: 17 U/L (ref 0–37)
Albumin: 4.3 g/dL (ref 3.5–5.2)
Alkaline Phosphatase: 66 U/L (ref 39–117)
BUN: 18 mg/dL (ref 6–23)
CO2: 28 mEq/L (ref 19–32)
Calcium: 9.7 mg/dL (ref 8.4–10.5)
Chloride: 104 mEq/L (ref 96–112)
Creatinine, Ser: 0.9 mg/dL (ref 0.40–1.20)
GFR: 62.33 mL/min (ref >60.00)
Glucose, Bld: 104 mg/dL — ABNORMAL HIGH (ref 70–99)
Potassium: 4.3 mEq/L (ref 3.5–5.1)
Sodium: 137 mEq/L (ref 135–145)
Total Bilirubin: 0.4 mg/dL (ref 0.2–1.2)
Total Protein: 7.3 g/dL (ref 6.0–8.3)

## 2020-05-13 NOTE — Progress Notes (Signed)
Chief Complaint  Patient presents with  . Hospitalization Follow-up    hypertension    Subjective Kelsey Stanley is a 69 y.o. female who presents for hypertension follow up. She was admitted for severe hypertension with endorgan compromise after an inner ear procedure.  She was admitted to the hospital and lisinopril was added to Toprol. She does monitor home blood pressures. Blood pressures ranging from 140-150's/80-90's on average. She is compliant with medications-Toprol 50 mg daily, lisinopril 20 mg daily. Patient has these side effects of medication: none She is adhering to a healthy diet overall. Current exercise: Active around the house    Past Medical History:  Diagnosis Date  . GERD (gastroesophageal reflux disease)   . Hyperlipidemia   . Hypertension     Exam BP (!) 144/80 (BP Location: Left Arm, Patient Position: Sitting, Cuff Size: Normal)   Pulse 103   Temp 98.5 F (36.9 C) (Oral)   Ht 5\' 2"  (1.575 m)   Wt 134 lb (60.8 kg)   SpO2 95%   BMI 24.51 kg/m  General:  well developed, well nourished, in no apparent distress Heart: RRR, no bruits, no LE edema Lungs: clear to auscultation, no accessory muscle use Psych: well oriented with normal range of affect and appropriate judgment/insight  Essential hypertension - Plan: Comprehensive metabolic panel, CBC  Her blood pressure has been trending down.  I wonder if she had a reaction to anesthesia or the actual procedure.  I will keep her on her current dosage of lisinopril and Toprol.  She will continue monitoring her blood pressures and send me her readings in 2 weeks.  We might be able to take her off of her lisinopril.  Counseled on diet and exercise. F/u in pending her readings that she will send me in 2 weeks. The patient voiced understanding and agreement to the plan.  [place], DO 05/13/20  11:40 AM

## 2020-05-13 NOTE — Patient Instructions (Addendum)
Give [place] 2-3 business days to get the results of your labs back.   Keep the diet clean and stay active.  Continue to monitor your blood pressures at home.  Stay on your medicine for now.  Send me your blood pressure readings in 2 weeks.   Let us know if you need anything.

## 2020-05-27 DIAGNOSIS — H90A32 Mixed conductive and sensorineural hearing loss, unilateral, left ear with restricted hearing on the contralateral side: Secondary | ICD-10-CM | POA: Diagnosis not present

## 2020-05-27 MED ORDER — LISINOPRIL 20 MG PO TABS: 20.0000 mg | ORAL_TABLET | Freq: Every day | ORAL | 0 refills | Status: DC

## 2020-06-04 ENCOUNTER — Telehealth: Payer: Self-pay | Admitting: Family Medicine

## 2020-06-04 NOTE — Telephone Encounter (Signed)
CallerAprille Stanley  Call Back # [phone]   Patient states the her blood pressure is elevated.        Her reading are 171/80, 164/103.  Please Advise

## 2020-06-05 ENCOUNTER — Ambulatory Visit: Payer: Medicare Other | Admitting: Medical

## 2020-06-05 MED ORDER — METOPROLOL SUCCINATE ER 100 MG PO TB24
100.0000 mg | ORAL_TABLET | Freq: Every day | ORAL | 3 refills | Status: DC
Start: 2020-06-05 — End: 2020-06-19

## 2020-06-05 NOTE — Telephone Encounter (Signed)
Continue lisinopril at 20 mg daily. Take 2 tabs of Toprol XL for a total of 100 mg daily. I will send in a new dosage so when she runs out, she will only need to take 1 daily. I want to see her in 2 weeks. Continue monitoring BP at home. TY.

## 2020-06-05 NOTE — Telephone Encounter (Signed)
Patient notified , and she also cancelled her appointment with Percell Miller 8/20 today for BP issues and has an appt scheduled for 8/30 .

## 2020-06-15 ENCOUNTER — Ambulatory Visit: Payer: Medicare Other | Admitting: Family Medicine

## 2020-06-19 ENCOUNTER — Encounter: Payer: Self-pay | Admitting: Family Medicine

## 2020-06-19 ENCOUNTER — Other Ambulatory Visit: Payer: Self-pay

## 2020-06-19 ENCOUNTER — Ambulatory Visit (INDEPENDENT_AMBULATORY_CARE_PROVIDER_SITE_OTHER): Payer: Medicare Other | Admitting: Family Medicine

## 2020-06-19 VITALS — BP 130/72 | HR 62 | Temp 97.5°F | Ht 62.0 in | Wt 137.0 lb

## 2020-06-19 DIAGNOSIS — I10 Essential (primary) hypertension: Secondary | ICD-10-CM

## 2020-06-19 DIAGNOSIS — F411 Generalized anxiety disorder: Secondary | ICD-10-CM

## 2020-06-19 MED ORDER — LORAZEPAM 0.5 MG PO TABS: 0.2500 mg | ORAL_TABLET | Freq: Two times a day (BID) | ORAL | 1 refills | Status: DC | PRN

## 2020-06-19 MED ORDER — LISINOPRIL 40 MG PO TABS: 40.0000 mg | ORAL_TABLET | Freq: Every day | ORAL | 3 refills | Status: DC

## 2020-06-19 MED ORDER — METOPROLOL SUCCINATE ER 50 MG PO TB24: 50.0000 mg | ORAL_TABLET | Freq: Every day | ORAL | 3 refills | Status: DC

## 2020-06-19 NOTE — Patient Instructions (Signed)
Keep the diet clean and stay active.  Check your blood pressure 2-3 times per week.   Let me know if there are any issues with your medicine.  Do not drink alcohol, do any illicit/street drugs, drive or do anything that requires alertness while on the lorazepam.  Please consider counseling. Contact [phone] to schedule an appointment or inquire about cost/insurance coverage.  Aim to do some physical exertion for 150 minutes per week. This is typically divided into 5 days per week, 30 minutes per day. The activity should be enough to get your heart rate up. Anything is better than nothing if you have time constraints.  Coping skills Choose 5 that work for you:  Take a deep breath  Count to 20  Read a book  Do a puzzle  Meditate  Bake  Sing  Knit  Garden  Pray  Go outside  Call a friend  Listen to music  Take a walk  Color  Send a note  Take a bath  Watch a movie  Be alone in a quiet place  Pet an animal  Visit a friend  Journal  Exercise  Stretch

## 2020-06-19 NOTE — Progress Notes (Signed)
Chief Complaint  Patient presents with  . Follow-up    blood pressure    Subjective Kelsey Stanley is a 69 y.o. female who presents for hypertension follow up. She does monitor home blood pressures. Blood pressures ranging from 130's/80's on average. She is compliant with medications- Toprol XL 100 mg/d, lisinopril 20 mg/d. Patient has these side effects of medication: Sleepy when Toprol is at 100 mg/d, did better at 50 mg/d. She is adhering to a healthy diet overall. Current exercise: walking  Anxious since her surgery also. Happens for entire days at a time, 2-3 days per week. Might be getting slightly better. No HI or SI. Not following with counselor/psychologist. Was on low dose Ativan in past that worked well, had no AE's.    Past Medical History:  Diagnosis Date  . GERD (gastroesophageal reflux disease)   . Hyperlipidemia   . Hypertension     Exam BP 130/72 (BP Location: Left Arm, Patient Position: Sitting, Cuff Size: Normal)   Pulse 62   Temp (!) 97.5 F (36.4 C) (Oral)   Ht 5\' 2"  (1.575 m)   Wt 137 lb (62.1 kg)   SpO2 98%   BMI 25.06 kg/m  General:  well developed, well nourished, in no apparent distress Heart: RRR, no bruits, no LE edema Lungs: clear to auscultation, no accessory muscle use Psych: well oriented with normal range of affect and appropriate judgment/insight  Essential hypertension - Plan: metoprolol succinate (TOPROL-XL) 50 MG 24 hr tablet, lisinopril (ZESTRIL) 40 MG tablet  GAD (generalized anxiety disorder) - Plan: LORazepam (ATIVAN) 0.5 MG tablet  1. Cont to monitor BP at home. Decrease dosage of Toprol to 50 mg/d and increase lisinopril dosage to 40 mg/d. Counseled on diet and exercise. 2. Start low dose Ativan for prn anxiety. Counseling info provided. Anxiety coping tech's provided.  F/u in 6 weeks to reck above.  The patient voiced understanding and agreement to the plan.  [place], DO 06/19/20  3:43 PM

## 2020-07-31 ENCOUNTER — Ambulatory Visit (INDEPENDENT_AMBULATORY_CARE_PROVIDER_SITE_OTHER): Payer: Medicare Other | Admitting: Family Medicine

## 2020-07-31 ENCOUNTER — Encounter: Payer: Self-pay | Admitting: Family Medicine

## 2020-07-31 ENCOUNTER — Other Ambulatory Visit: Payer: Self-pay

## 2020-07-31 VITALS — BP 132/82 | HR 94 | Temp 98.3°F | Ht 63.0 in | Wt 136.0 lb

## 2020-07-31 DIAGNOSIS — I10 Essential (primary) hypertension: Secondary | ICD-10-CM | POA: Diagnosis not present

## 2020-07-31 DIAGNOSIS — B880 Other acariasis: Secondary | ICD-10-CM

## 2020-07-31 DIAGNOSIS — B079 Viral wart, unspecified: Secondary | ICD-10-CM

## 2020-07-31 DIAGNOSIS — Z23 Encounter for immunization: Secondary | ICD-10-CM

## 2020-07-31 MED ORDER — METOPROLOL SUCCINATE ER 50 MG PO TB24: 100.0000 mg | ORAL_TABLET | Freq: Every day | ORAL | 3 refills | Status: DC

## 2020-07-31 MED ORDER — PREDNISONE 20 MG PO TABS: 40.0000 mg | ORAL_TABLET | Freq: Every day | ORAL | 0 refills | Status: AC

## 2020-07-31 NOTE — Patient Instructions (Addendum)
Try not to itch.  Take 100 mg of Toprol at night. Take lisinopril in the morning.   Continue checking your blood pressure at home.  Keep the diet clean and stay active.  Aim to do some physical exertion for 150 minutes per week. This is typically divided into 5 days per week, 30 minutes per day. The activity should be enough to get your heart rate up. Anything is better than nothing if you have time constraints.  Take some aspirin and mash it up. Add some water to make a paste. Apply it to your warts daily for 10-15 minutes at a time. This can take weeks.  Let us know if you need anything.

## 2020-07-31 NOTE — Progress Notes (Signed)
Chief Complaint  Patient presents with  . Follow-up    Subjective Kelsey Stanley is a 69 y.o. female who presents for hypertension follow up. She does monitor home blood pressures. Blood pressures ranging from 120-150's/70-80's on average. She is compliant with medications- Toprol XL 50 mg/d, lisinopril 40 mg/d. Patient has these side effects of medication: none She is sometimes adhering to a healthy diet overall. Current exercise: none  Pt bitten by chiggers. Very itchy. Has not tried anything yet. No pain, drainage, new topicals.   Pt has a wart behind her L knee and on her finger. Tried having it frozen in the past, received 2 tx and it did not work. She reports it was quite painful.    Past Medical History:  Diagnosis Date  . GERD (gastroesophageal reflux disease)   . Hyperlipidemia   . Hypertension     Exam BP 132/82 (BP Location: Left Arm, Patient Position: Sitting, Cuff Size: Normal)   Pulse 94   Temp 98.3 F (36.8 C) (Oral)   Ht 5\' 3"  (1.6 m)   Wt 136 lb (61.7 kg)   SpO2 99%   BMI 24.09 kg/m  General:  well developed, well nourished, in no apparent distress Heart: RRR, no bruits, no LE edema Lungs: clear to auscultation, no accessory muscle use Skin: Raised and flesh-colored lesion over the palmar 3rd digit, IP. No ttp. Small lesions of the same over distal posterior thigh and popliteal area. There are also scattered erythematous papules over the anterior torso with excoriation.  Psych: well oriented with normal range of affect and appropriate judgment/insight  Essential hypertension - Plan: metoprolol succinate (TOPROL-XL) 50 MG 24 hr tablet  [place]  Chigger bites - Plan: predniSONE (DELTASONE) 20 MG tablet  Need for influenza vaccination - Plan: Flu Vaccine QUAD High Dose(Fluad)  1. Lisinopril 40 mg/d in AM. Increase metoprolol 50 mg/d to 100 mg/d, at night. Cont to monitor BP. Counseled on diet and exercise. 2. Aspirin paste daily. Can shave in  future. 3. 40 mg/d for 5 d prednisone. Try not to itch.  F/u in 1 mo. The patient voiced understanding and agreement to the plan.  [place], DO 07/31/20  10:56 AM

## 2020-08-06 DIAGNOSIS — Z23 Encounter for immunization: Secondary | ICD-10-CM | POA: Diagnosis not present

## 2020-09-14 DIAGNOSIS — H838X3 Other specified diseases of inner ear, bilateral: Secondary | ICD-10-CM | POA: Diagnosis not present

## 2020-09-14 DIAGNOSIS — H903 Sensorineural hearing loss, bilateral: Secondary | ICD-10-CM | POA: Diagnosis not present

## 2020-10-05 ENCOUNTER — Ambulatory Visit (INDEPENDENT_AMBULATORY_CARE_PROVIDER_SITE_OTHER): Payer: Medicare Other | Admitting: Family Medicine

## 2020-10-05 ENCOUNTER — Other Ambulatory Visit: Payer: Self-pay

## 2020-10-05 ENCOUNTER — Encounter: Payer: Self-pay | Admitting: Family Medicine

## 2020-10-05 VITALS — BP 132/82 | HR 58 | Temp 97.7°F | Ht 62.0 in | Wt 139.5 lb

## 2020-10-05 DIAGNOSIS — M25561 Pain in right knee: Secondary | ICD-10-CM | POA: Diagnosis not present

## 2020-10-05 DIAGNOSIS — E785 Hyperlipidemia, unspecified: Secondary | ICD-10-CM

## 2020-10-05 DIAGNOSIS — I10 Essential (primary) hypertension: Secondary | ICD-10-CM

## 2020-10-05 DIAGNOSIS — R7303 Prediabetes: Secondary | ICD-10-CM | POA: Diagnosis not present

## 2020-10-05 LAB — COMPREHENSIVE METABOLIC PANEL
ALT: 14 U/L (ref 0–35)
AST: 14 U/L (ref 0–37)
Albumin: 4.4 g/dL (ref 3.5–5.2)
Alkaline Phosphatase: 80 U/L (ref 39–117)
BUN: 21 mg/dL (ref 6–23)
CO2: 28 mEq/L (ref 19–32)
Calcium: 9.3 mg/dL (ref 8.4–10.5)
Chloride: 106 mEq/L (ref 96–112)
Creatinine, Ser: 0.93 mg/dL (ref 0.40–1.20)
GFR: 63.36 mL/min (ref >60.00)
Glucose, Bld: 102 mg/dL — ABNORMAL HIGH (ref 70–99)
Potassium: 4.1 mEq/L (ref 3.5–5.1)
Sodium: 141 mEq/L (ref 135–145)
Total Bilirubin: 0.3 mg/dL (ref 0.2–1.2)
Total Protein: 7.1 g/dL (ref 6.0–8.3)

## 2020-10-05 LAB — LIPID PANEL
Cholesterol: 191 mg/dL (ref 0–200)
HDL: 51.1 mg/dL (ref >39.00)
LDL Cholesterol: 111 mg/dL — ABNORMAL HIGH (ref 0–99)
NonHDL: 140.09
Total CHOL/HDL Ratio: 4
Triglycerides: 145 mg/dL (ref 0.0–149.0)
VLDL: 29 mg/dL (ref 0.0–40.0)

## 2020-10-05 LAB — HEMOGLOBIN A1C: Hgb A1c MFr Bld: 6 % (ref 4.6–6.5)

## 2020-10-05 MED ORDER — METOPROLOL SUCCINATE ER 100 MG PO TB24: 100.0000 mg | ORAL_TABLET | Freq: Every day | ORAL | 2 refills | Status: DC

## 2020-10-05 MED ORDER — PRAVASTATIN SODIUM 40 MG PO TABS: 40.0000 mg | ORAL_TABLET | Freq: Every day | ORAL | 2 refills | Status: DC

## 2020-10-05 MED ORDER — PANTOPRAZOLE SODIUM 20 MG PO TBEC: 20.0000 mg | DELAYED_RELEASE_TABLET | Freq: Every day | ORAL | 2 refills | Status: DC

## 2020-10-05 NOTE — Patient Instructions (Addendum)
Keep the diet clean and stay active.  Give [place] 2-3 business days to get the results of your labs back.   Try Voltaren gel over the top of your knee.  Ice/cold pack over area for 10-15 min twice daily.  Let us know if you need anything.  Stretching and range of motion exercises These exercises warm up your muscles and joints and improve the movement and flexibility of your knee. These exercises also help to relieve pain and stiffness.  Exercise A: Knee flexion, active 1. Lie on your back with both knees straight. If this causes back discomfort, bend your uninjured knee so your foot is flat on the floor. 2. Slowly slide your left / right heel back toward your buttocks until you feel a gentle stretch in the front of your knee or thigh. Stop if you have pain. 3. Hold for3 seconds. 4. Slowly slide your left / right heel back to the starting position. 10 total repetitions. Repeat 2 times. Complete this exercise 3 times a week.  Exercise B: Knee extension, sitting 1. Sit with your left / right heel propped on a chair, a coffee table, or a footstool. Do not have anything under your knee to support it. 2. Allow your leg muscles to relax, letting gravity straighten out your knee. You should feel a stretch behind your left / right knee. 3. If told by your health care provide just above your kneecap. 4. Hold this position for 3 seconds. 5. Repeat for a total of 10 repetitions. Repeat 2 times. Complete this stretch 3 times a week.  Strengthening exercises These exercises build strength and endurance in your knee. Endurance is the ability to use your muscles for a long time, even after they get tired.  Exercise C: Quadriceps, isometric 1. Lie on your back with your left / right leg extended and your other knee bent. Put a rolled towel or small pillow under your right/left knee if told by your health care provider. 2. Slowly tense the muscles in the front of your left / right thigh by pushing the  back of your knee down. You should see your knee cap slide up toward your hip or see increased dimpling just above the knee. 3. For 3 seconds, keep the muscle as tight as you can without increasing your pain. 4. Relax the muscles slowly and completely. Repeat for 10 total repetitions. Repeat 2 times. Complete this exercise 3 times a week. Exercise D: Straight leg raises (quadriceps) 1. Lie on your back with your left / right leg extended and your other knee bent. 2. Tense the muscles in the front of your left / right thigh. You should see your kneecap slide up or see increased dimpling just above the knee. 3. Keep these muscles tight as you raise your leg 4-6 inches (10-15 cm) off the floor. 4. Hold this position for 3 seconds. 5. Keep these muscles tense as you lower your leg. 6. Relax the muscles slowly and completely. Repeat for a total of 10 repetitions. Repeat 2 times. Complete this exercise 3 times a week.  Exercise E: Hamstring curls 1. On the floor or a bed, lie on your abdomen with your legs straight. Put a folded towel or small pillow under your left / right thigh, just above your kneecap. 2. Slowly bend your left / right knee as far as you can without pain. Keep your hips flat against the floor or bed. 3. Hold this position for 3 seconds. 4. Slowly lower  your leg to the starting position. Repeat for a total of 10 repetitions. Repeat 2 times. Complete this exercise 3 times per week.  Stretching exercises These exercises warm up your muscles and joints and improve the movement and flexibility of your knee. These exercises also help to relieve pain and stiffness.  Exercise A: Quadriceps, prone 1. Lie on your abdomen on a firm surface, such as a bed or padded floor. 2. Bend your left / right knee and hold your ankle. If you cannot reach your ankle or pant leg, loop a belt around your foot and grab the belt instead. 3. Gently pull your heel toward your buttocks. Your knee should not  slide out to the side. You should feel a stretch in the front of your thigh and knee. 4. Hold this position for 30 seconds. Repeat 2 times. Complete this stretch 3 times a week.  Exercise B: Hamstring, doorway 1. Lie on your back in front of a doorway with your left / right leg resting against the wall and your other leg flat on the floor in the doorway. There should be a slight bend in your left / right knee. 2. Straighten your left / right knee. You should feel a stretch behind your knee or thigh. If you do not feel that stretch, scoot your buttocks closer to the door. 3. Hold this position for 30 seconds. Repeat 2 times. Complete this stretch 3 times a week.  Strengthening exercises These exercises build strength and endurance in your knee and leg muscles. Endurance is the ability to use your muscles for a long time, even after they get tired.   Exercise D: Wall slides (quadriceps) 1. Lean your back against a smooth wall or door, and walk your feet out 18-24 inches (45-61 cm) from it. 2. Place your feet hip-width apart. 3. Slowly slide down the wall or door until your knees bend 90 degrees. Keep your knees over your heels, not over your toes. Keep your knees in line with your hips. 4. Hold for 2 seconds. 5. Stand up to rest for 60 seconds. Repeat 2 times. Complete this exercise 3 times a week.  Exercise E: Bridge (hip extensors) 1. Lie on your back on a firm surface with your knees bent and your feet flat on the floor. 2. Tighten your buttocks muscles and lift your bottom off the floor until your trunk is level with your thighs. ? Do not arch your back. ? You should feel the muscles working in your buttocks and the back of your thighs. 3. Hold this position for 2 seconds. 4. Slowly lower your hips to the starting position. 5. Let your buttocks muscles relax completely between repetitions. Repeat 2 times. Complete this exercise 3 times a week.

## 2020-10-05 NOTE — Progress Notes (Signed)
Chief Complaint  Patient presents with  . Follow-up    Subjective Kelsey Stanley is a 69 y.o. female who presents for hypertension follow up. She does monitor home blood pressures. Blood pressures ranging from 110-120's/70's on average. She is compliant with medications- Toprol XL 100 mg/d, lisinopril 40 mg/d. Patient has these side effects of medication: none She is adhering to a healthy diet overall. Current exercise: walking  Hyperlipidemia Patient presents for dyslipidemia follow up. Currently being treated with pravastatin 40 mg/d and compliance with treatment thus far has been good. She denies myalgias. Diet/exercise as above.  The patient is not known to have coexisting coronary artery disease.   R knee pain 1 mo-no inj or change in activity. Has been using NSAIDs intermittently that does help.  No neurologic signs or symptoms.  He is not catching or locking.  There is no swelling, redness, or bruising.  Hurts in the front.  Past Medical History:  Diagnosis Date  . GERD (gastroesophageal reflux disease)   . Hyperlipidemia   . Hypertension     Exam BP 132/82 (BP Location: Left Arm, Patient Position: Sitting, Cuff Size: Normal)   Pulse (!) 58   Temp 97.7 F (36.5 C) (Oral)   Ht 5\' 2"  (1.575 m)   Wt 139 lb 8 oz (63.3 kg)   SpO2 98%   BMI 25.51 kg/m  General:  well developed, well nourished, in no apparent distress MSK: No deformity, effusion, or soft tissue edema.  There is no bony tenderness.  Negative patellar apprehension/grind, varus/valgus stress, Stines, Lachman's Heart: RRR, no bruits, no LE edema Lungs: clear to auscultation, no accessory muscle use Psych: well oriented with normal range of affect and appropriate judgment/insight  Essential hypertension  Hyperlipidemia, unspecified hyperlipidemia type - Plan: Lipid panel, Comprehensive metabolic panel  Prediabetes - Plan: Hemoglobin A1c  Acute pain of right knee  1. Continue lisinopril 40 mg daily,  Toprol-XL 100 mg daily.  Counseled on diet and exercise.  Check blood pressure a few times a month. 2.  Check labs, continue pravastatin 40 mg daily. 3.  Check A1c 4.  Stretches and exercises.  I do not believe there is any structural damage.  Consider arthritis versus patellofemoral syndrome.  Would get x-ray if no better. F/u in 6 months or as needed. The patient voiced understanding and agreement to the plan.  Hodgkins, DO 10/05/20  9:23 AM

## 2020-10-22 DIAGNOSIS — H40013 Open angle with borderline findings, low risk, bilateral: Secondary | ICD-10-CM | POA: Diagnosis not present

## 2020-12-02 NOTE — Progress Notes (Signed)
Subjective:   Kelsey Stanley is a 70 y.o. female who presents for Medicare Annual (Subsequent) preventive examination.  Review of Systems     Cardiac Risk Factors include: advanced age (>5men, >26 women);dyslipidemia;hypertension;sedentary lifestyle     Objective:    Today's Vitals   12/03/20 0856 12/03/20 0900  BP: (!) 144/70   Pulse: 69   Resp: 16   Temp: 97.7 F (36.5 C)   TempSrc: Oral   SpO2: 97%   Weight: 137 lb 9.6 oz (62.4 kg)   Height: 5\' 2"  (1.575 m)   PainSc:  1    Body mass index is 25.17 kg/m.  Advanced Directives 12/03/2020 05/07/2020 05/07/2020 10/04/2019 10/02/2018  Does Patient Have a Medical Advance Directive? No No No No No  Would patient like information on creating a medical advance directive? No - Patient declined No - Patient declined No - Patient declined No - Patient declined No - Patient declined    Current Medications (verified) Outpatient Encounter Medications as of 12/03/2020  Medication Sig  . cetirizine (ZYRTEC) 10 MG tablet Take 10 mg by mouth daily.  . cholecalciferol (VITAMIN D) 400 units TABS tablet Take 400 Units by mouth daily.   Marland Kitchen lisinopril (ZESTRIL) 40 MG tablet Take 1 tablet (40 mg total) by mouth daily.  . metoprolol succinate (TOPROL-XL) 100 MG 24 hr tablet Take 1 tablet (100 mg total) by mouth daily. Take with or immediately following a meal.  . pantoprazole (PROTONIX) 20 MG tablet Take 1 tablet (20 mg total) by mouth daily.  . pravastatin (PRAVACHOL) 40 MG tablet Take 1 tablet (40 mg total) by mouth daily.  . Probiotic Product (PROBIOTIC-10 PO) Take 1 capsule by mouth daily.    No facility-administered encounter medications on file as of 12/03/2020.    Allergies (verified) Morphine and related, Amoxicillin, and Erythromycin   History: Past Medical History:  Diagnosis Date  . GERD (gastroesophageal reflux disease)   . Hyperlipidemia   . Hypertension    Past Surgical History:  Procedure Laterality Date  . ABDOMINAL  HYSTERECTOMY  1993  . BREAST BIOPSY Right 1994  . CHOLECYSTECTOMY, LAPAROSCOPIC  1993   Family History  Problem Relation Age of Onset  . Diabetes Mother   . Hyperlipidemia Mother   . Hypertension Mother   . Glaucoma Mother   . Cancer Mother        bladder cancer  . Heart disease Father   . Diabetes Sister   . Hypertension Sister   . Hyperlipidemia Brother   . Hypertension Brother   . Diabetes Sister   . Hypertension Sister    Social History   Socioeconomic History  . Marital status: Married    Spouse name: Not on file  . Number of children: Not on file  . Years of education: Not on file  . Highest education level: Not on file  Occupational History  . Not on file  Tobacco Use  . Smoking status: Never Smoker  . Smokeless tobacco: Never Used  Substance and Sexual Activity  . Alcohol use: Never  . Drug use: Never  . Sexual activity: Yes  Other Topics Concern  . Not on file  Social History Narrative  . Not on file   Social Determinants of Health   Financial Resource Strain: Low Risk   . Difficulty of Paying Living Expenses: Not hard at all  Food Insecurity: No Food Insecurity  . Worried About Charity fundraiser in the Last Year: Never true  .  Ran Out of Food in the Last Year: Never true  Transportation Needs: No Transportation Needs  . Lack of Transportation (Medical): No  . Lack of Transportation (Non-Medical): No  Physical Activity: Inactive  . Days of Exercise per Week: 0 days  . Minutes of Exercise per Session: 0 min  Stress: No Stress Concern Present  . Feeling of Stress : Not at all  Social Connections: Moderately Integrated  . Frequency of Communication with Friends and Family: More than three times a week  . Frequency of Social Gatherings with Friends and Family: More than three times a week  . Attends Religious Services: More than 4 times per year  . Active Member of Clubs or Organizations: No  . Attends Archivist Meetings: Never  .  Marital Status: Married    Tobacco Counseling Counseling given: Not Answered   Clinical Intake:  Pre-visit preparation completed: Yes  Pain : 0-10 Pain Score: 1  Pain Type: Acute pain Pain Location: Back Pain Onset: Today Pain Frequency: Intermittent     Nutritional Status: BMI 25 -29 Overweight Nutritional Risks: None Diabetes: No  How often do you need to have someone help you when you read instructions, pamphlets, or other written materials from your doctor or pharmacy?: 1 - Never  Diabetic?No  Interpreter Needed?: No  Information entered by :: Caroleen Hamman LPN   Activities of Daily Living In your present state of health, do you have any difficulty performing the following activities: 12/03/2020 05/07/2020  Hearing? N N  Comment - had  surgery on left ear 05/05/20 to improve hearing in left ear  Vision? N N  Difficulty concentrating or making decisions? N N  Walking or climbing stairs? N N  Dressing or bathing? N N  Doing errands, shopping? N N  Preparing Food and eating ? N -  Using the Toilet? N -  In the past six months, have you accidently leaked urine? N -  Do you have problems with loss of bowel control? N -  Managing your Medications? N -  Managing your Finances? N -  Housekeeping or managing your Housekeeping? N -  Some recent data might be hidden    Patient Care Team: Shelda Pal, DO as PCP - General (Family Medicine)  Indicate any recent Medical Services you may have received from other than [facility] providers in the past year (date may be approximate).     Assessment:   This is a routine wellness examination for [place].  Hearing/Vision screen  Hearing Screening   125Hz  250Hz  500Hz  1000Hz  2000Hz  3000Hz  4000Hz  6000Hz  8000Hz   Right ear:           Left ear:           Comments: No issues  Vision Screening Comments: Wears glasses Last eye exam-10/2020-Dr. Dunn  Dietary issues and exercise activities discussed: Current Exercise  Habits: The patient does not participate in regular exercise at present, Exercise limited by: None identified  Goals    . Increase physical activity     Do exercise videos      Depression Screen PHQ 2/9 Scores 12/03/2020 10/04/2019 10/02/2018  PHQ - 2 Score 0 0 0    Fall Risk Fall Risk  12/03/2020 10/04/2019 10/02/2018  Falls in the past year? 0 0 0  Number falls in past yr: 0 - -  Injury with Fall? 0 - -  Follow up Falls prevention discussed Education provided;Falls prevention discussed -    FALL RISK PREVENTION PERTAINING TO  THE HOME:  Any stairs in or around the home? No  Home free of loose throw rugs in walkways, pet beds, electrical cords, etc? Yes  Adequate lighting in your home to reduce risk of falls? Yes   ASSISTIVE DEVICES UTILIZED TO PREVENT FALLS:  Life alert? No  Use of a cane, walker or w/c? No  Grab bars in the bathroom? No  Shower chair or bench in shower? No  Elevated toilet seat or a handicapped toilet? No   TIMED UP AND GO:  Was the test performed? Yes .  Length of time to ambulate 10 feet: 9 sec.   Gait steady and fast without use of assistive device  Cognitive Function:Normal cognitive status assessed by direct observation by this Nurse Health Advisor. No abnormalities found.          Immunizations Immunization History  Administered Date(s) Administered  . Fluad Quad(high Dose 65+) 07/18/2019, 07/31/2020  . Influenza-Unspecified 08/09/2016, 06/19/2018  . PFIZER(Purple Top)SARS-COV-2 Vaccination 11/06/2019, 11/27/2019, 08/17/2020  . Pneumococcal Conjugate-13 06/19/2018  . Pneumococcal Polysaccharide-23 12/03/2020  . Tdap 10/02/2018  . Zoster Recombinat (Shingrix) 01/16/2019, 06/18/2019    TDAP status: Up to date  Flu Vaccine status: Up to date  Pneumococcal vaccine status: Completed during today's visit.  Covid-19 vaccine status: Completed vaccines  Qualifies for Shingles Vaccine? Yes   Zostavax completed Yes   Shingrix  Completed?: Yes  Screening Tests Health Maintenance  Topic Date Due  . MAMMOGRAM  03/23/2022  . COLONOSCOPY (Pts 45-22yrs Insurance coverage will need to be confirmed)  06/17/2028  . TETANUS/TDAP  10/02/2028  . INFLUENZA VACCINE  Completed  . DEXA SCAN  Completed  . COVID-19 Vaccine  Completed  . Hepatitis C Screening  Completed  . PNA vac Low Risk Adult  Completed    Health Maintenance  There are no preventive care reminders to display for this patient.  Colorectal cancer screening: Type of screening: Colonoscopy. Completed 06/17/2018. Repeat every 10 years  Mammogram status: Completed Bilateral 03/23/2020. Repeat every year  Bone Density status: Due- Patient wishes to postpone & complete with next mammogram.  Lung Cancer Screening: (Low Dose CT Chest recommended if Age 29-80 years, 30 pack-year currently smoking OR have quit w/in 15years.) does not qualify.    Additional Screening:  Hepatitis C Screening:Completed 04/03/2019  Vision Screening: Recommended annual ophthalmology exams for early detection of glaucoma and other disorders of the eye. Is the patient up to date with their annual eye exam?  Yes  Who is the provider or what is the name of the office in which the patient attends annual eye exams? Dr. Idolina Primer   Dental Screening: Recommended annual dental exams for proper oral hygiene  Community Resource Referral / Chronic Care Management: CRR required this visit?  No   CCM required this visit?  No      Plan:     I have personally reviewed and noted the following in the patient's chart:   . Medical and social history . Use of alcohol, tobacco or illicit drugs  . Current medications and supplements . Functional ability and status . Nutritional status . Physical activity . Advanced directives . List of other physicians . Hospitalizations, surgeries, and ER visits in previous 12 months . Vitals . Screenings to include cognitive, depression, and  falls . Referrals and appointments  In addition, I have reviewed and discussed with patient certain preventive protocols, quality metrics, and best practice recommendations. A written personalized care plan for preventive services as well as general  preventive health recommendations were provided to patient.   Patient to access avs on mychart.  Marta Antu, LPN   02/03/9143  Nurse Health Advisor  Nurse Notes: None

## 2020-12-03 ENCOUNTER — Other Ambulatory Visit: Payer: Self-pay

## 2020-12-03 ENCOUNTER — Ambulatory Visit (INDEPENDENT_AMBULATORY_CARE_PROVIDER_SITE_OTHER): Payer: Medicare Other

## 2020-12-03 VITALS — BP 144/70 | HR 69 | Temp 97.7°F | Resp 16 | Ht 62.0 in | Wt 137.6 lb

## 2020-12-03 DIAGNOSIS — Z0000 Encounter for general adult medical examination without abnormal findings: Secondary | ICD-10-CM | POA: Diagnosis not present

## 2020-12-03 DIAGNOSIS — Z23 Encounter for immunization: Secondary | ICD-10-CM

## 2020-12-03 NOTE — Patient Instructions (Signed)
Kelsey Stanley , Thank you for taking time to come for your Medicare Wellness Visit. I appreciate your ongoing commitment to your health goals. Please review the following plan we discussed and let me know if I can assist you in the future.   Screening recommendations/referrals: Colonoscopy: Completed 06/17/2018-Due 06/17/2028 Mammogram: Completed 03/23/2020-Due 03/23/2021 Bone Density: Due- Per our conversation, you would like to complete with next mammogram. Recommended yearly ophthalmology/optometry visit for glaucoma screening and checkup Recommended yearly dental visit for hygiene and checkup  Vaccinations: Influenza vaccine: Up to date Pneumococcal vaccine: Completed vaccines Tdap vaccine: Up to date- Due-10/02/2028 Shingles vaccine: Completed vaccines  Covid-19:Completed vaccines  Advanced directives: Declined information today.  Conditions/risks identified: See problem list  Next appointment: Follow up in one year for your annual wellness visit 12/09/2021 @ 9:00   Preventive Care 65 Years and Older, Female Preventive care refers to lifestyle choices and visits with your health care provider that can promote health and wellness. What does preventive care include?  A yearly physical exam. This is also called an annual well check.  Dental exams once or twice a year.  Routine eye exams. Ask your health care provider how often you should have your eyes checked.  Personal lifestyle choices, including:  Daily care of your teeth and gums.  Regular physical activity.  Eating a healthy diet.  Avoiding tobacco and drug use.  Limiting alcohol use.  Practicing safe sex.  Taking low-dose aspirin every day.  Taking vitamin and mineral supplements as recommended by your health care provider. What happens during an annual well check? The services and screenings done by your health care provider during your annual well check will depend on your age, overall health, lifestyle risk  factors, and family history of disease. Counseling  Your health care provider may ask you questions about your:  Alcohol use.  Tobacco use.  Drug use.  Emotional well-being.  Home and relationship well-being.  Sexual activity.  Eating habits.  History of falls.  Memory and ability to understand (cognition).  Work and work Statistician.  Reproductive health. Screening  You may have the following tests or measurements:  Height, weight, and BMI.  Blood pressure.  Lipid and cholesterol levels. These may be checked every 5 years, or more frequently if you are over 55 years old.  Skin check.  Lung cancer screening. You may have this screening every year starting at age 47 if you have a 30-pack-year history of smoking and currently smoke or have quit within the past 15 years.  Fecal occult blood test (FOBT) of the stool. You may have this test every year starting at age 63.  Flexible sigmoidoscopy or colonoscopy. You may have a sigmoidoscopy every 5 years or a colonoscopy every 10 years starting at age 57.  Hepatitis C blood test.  Hepatitis B blood test.  Sexually transmitted disease (STD) testing.  Diabetes screening. This is done by checking your blood sugar (glucose) after you have not eaten for a while (fasting). You may have this done every 1-3 years.  Bone density scan. This is done to screen for osteoporosis. You may have this done starting at age 46.  Mammogram. This may be done every 1-2 years. Talk to your health care provider about how often you should have regular mammograms. Talk with your health care provider about your test results, treatment options, and if necessary, the need for more tests. Vaccines  Your health care provider may recommend certain vaccines, such as:  Influenza vaccine. This  is recommended every year.  Tetanus, diphtheria, and acellular pertussis (Tdap, Td) vaccine. You may need a Td booster every 10 years.  Zoster vaccine. You  may need this after age 32.  Pneumococcal 13-valent conjugate (PCV13) vaccine. One dose is recommended after age 47.  Pneumococcal polysaccharide (PPSV23) vaccine. One dose is recommended after age 26. Talk to your health care provider about which screenings and vaccines you need and how often you need them. This information is not intended to replace advice given to you by your health care provider. Make sure you discuss any questions you have with your health care provider. Document Released: 10/30/2015 Document Revised: 06/22/2016 Document Reviewed: 08/04/2015 Elsevier Interactive Patient Education  2017 [place] Prevention in the Home Falls can cause injuries. They can happen to people of all ages. There are many things you can do to make your home safe and to help prevent falls. What can I do on the outside of my home?  Regularly fix the edges of walkways and driveways and fix any cracks.  Remove anything that might make you trip as you walk through a door, such as a raised step or threshold.  Trim any bushes or trees on the path to your home.  Use bright outdoor lighting.  Clear any walking paths of anything that might make someone trip, such as rocks or tools.  Regularly check to see if handrails are loose or broken. Make sure that both sides of any steps have handrails.  Any raised decks and porches should have guardrails on the edges.  Have any leaves, snow, or ice cleared regularly.  Use sand or salt on walking paths during winter.  Clean up any spills in your garage right away. This includes oil or grease spills. What can I do in the bathroom?  Use night lights.  Install grab bars by the toilet and in the tub and shower. Do not use towel bars as grab bars.  Use non-skid mats or decals in the tub or shower.  If you need to sit down in the shower, use a plastic, non-slip stool.  Keep the floor dry. Clean up any water that spills on the floor as soon as  it happens.  Remove soap buildup in the tub or shower regularly.  Attach bath mats securely with double-sided non-slip rug tape.  Do not have throw rugs and other things on the floor that can make you trip. What can I do in the bedroom?  Use night lights.  Make sure that you have a light by your bed that is easy to reach.  Do not use any sheets or blankets that are too big for your bed. They should not hang down onto the floor.  Have a firm chair that has side arms. You can use this for support while you get dressed.  Do not have throw rugs and other things on the floor that can make you trip. What can I do in the kitchen?  Clean up any spills right away.  Avoid walking on wet floors.  Keep items that you use a lot in easy-to-reach places.  If you need to reach something above you, use a strong step stool that has a grab bar.  Keep electrical cords out of the way.  Do not use floor polish or wax that makes floors slippery. If you must use wax, use non-skid floor wax.  Do not have throw rugs and other things on the floor that can make you  trip. What can I do with my stairs?  Do not leave any items on the stairs.  Make sure that there are handrails on both sides of the stairs and use them. Fix handrails that are broken or loose. Make sure that handrails are as long as the stairways.  Check any carpeting to make sure that it is firmly attached to the stairs. Fix any carpet that is loose or worn.  Avoid having throw rugs at the top or bottom of the stairs. If you do have throw rugs, attach them to the floor with carpet tape.  Make sure that you have a light switch at the top of the stairs and the bottom of the stairs. If you do not have them, ask someone to add them for you. What else can I do to help prevent falls?  Wear shoes that:  Do not have high heels.  Have rubber bottoms.  Are comfortable and fit you well.  Are closed at the toe. Do not wear sandals.  If  you use a stepladder:  Make sure that it is fully opened. Do not climb a closed stepladder.  Make sure that both sides of the stepladder are locked into place.  Ask someone to hold it for you, if possible.  Clearly mark and make sure that you can see:  Any grab bars or handrails.  First and last steps.  Where the edge of each step is.  Use tools that help you move around (mobility aids) if they are needed. These include:  Canes.  Walkers.  Scooters.  Crutches.  Turn on the lights when you go into a dark area. Replace any light bulbs as soon as they burn out.  Set up your furniture so you have a clear path. Avoid moving your furniture around.  If any of your floors are uneven, fix them.  If there are any pets around you, be aware of where they are.  Review your medicines with your doctor. Some medicines can make you feel dizzy. This can increase your chance of falling. Ask your doctor what other things that you can do to help prevent falls. This information is not intended to replace advice given to you by your health care provider. Make sure you discuss any questions you have with your health care provider. Document Released: 07/30/2009 Document Revised: 03/10/2016 Document Reviewed: 11/07/2014 Elsevier Interactive Patient Education  2017 [employer].

## 2021-03-29 DIAGNOSIS — Z1231 Encounter for screening mammogram for malignant neoplasm of breast: Secondary | ICD-10-CM | POA: Diagnosis not present

## 2021-03-29 DIAGNOSIS — M8588 Other specified disorders of bone density and structure, other site: Secondary | ICD-10-CM | POA: Diagnosis not present

## 2021-03-29 DIAGNOSIS — Z780 Asymptomatic menopausal state: Secondary | ICD-10-CM | POA: Diagnosis not present

## 2021-03-29 LAB — HM MAMMOGRAPHY

## 2021-03-29 LAB — HM DEXA SCAN

## 2021-03-30 ENCOUNTER — Encounter: Payer: Self-pay | Admitting: Family Medicine

## 2021-03-31 ENCOUNTER — Encounter: Payer: Self-pay | Admitting: Family Medicine

## 2021-04-05 ENCOUNTER — Ambulatory Visit (INDEPENDENT_AMBULATORY_CARE_PROVIDER_SITE_OTHER): Payer: Medicare Other | Admitting: Family Medicine

## 2021-04-05 ENCOUNTER — Other Ambulatory Visit: Payer: Self-pay

## 2021-04-05 ENCOUNTER — Encounter: Payer: Self-pay | Admitting: Family Medicine

## 2021-04-05 VITALS — BP 128/72 | HR 51 | Temp 97.7°F | Ht 62.0 in | Wt 137.0 lb

## 2021-04-05 DIAGNOSIS — E785 Hyperlipidemia, unspecified: Secondary | ICD-10-CM

## 2021-04-05 DIAGNOSIS — I10 Essential (primary) hypertension: Secondary | ICD-10-CM | POA: Diagnosis not present

## 2021-04-05 DIAGNOSIS — K219 Gastro-esophageal reflux disease without esophagitis: Secondary | ICD-10-CM

## 2021-04-05 MED ORDER — LISINOPRIL 40 MG PO TABS: 40.0000 mg | ORAL_TABLET | Freq: Every day | ORAL | 3 refills | Status: DC

## 2021-04-05 MED ORDER — VALACYCLOVIR HCL 1 G PO TABS: ORAL_TABLET | ORAL | 2 refills | Status: DC

## 2021-04-05 MED ORDER — VALACYCLOVIR HCL 1 G PO TABS: ORAL_TABLET | ORAL | 1 refills | Status: DC

## 2021-04-05 NOTE — Progress Notes (Signed)
Chief Complaint  Patient presents with   Follow-up    Subjective Kelsey Stanley is a 70 y.o. female who presents for hypertension follow up. She does monitor home blood pressures. Blood pressures ranging from 120's/50-60's on average. She is compliant with medications- lisinopril 40 mg/d, Toprol XL 100 mg/d. Patient has these side effects of medication: none She is sometimes adhering to a healthy diet overall. Current exercise: none No CP or SOB.  Hyperlipidemia Patient presents for dyslipidemia follow up. Currently being treated with pravastatin 40 mg/d and compliance with treatment thus far has been good. She denies myalgias. Diet/exercise as above.  The patient is not known to have coexisting coronary artery disease.  GERD Controlled on Protonix 20 mg/d. No AE's, reports compliance. Denies N/V, bleeding, abd pain, unintentional wt loss.    Past Medical History:  Diagnosis Date   GERD (gastroesophageal reflux disease)    Hyperlipidemia    Hypertension     Exam BP 128/72   Pulse (!) 51   Temp 97.7 F (36.5 C) (Oral)   Ht 5\' 2"  (1.575 m)   Wt 137 lb (62.1 kg)   SpO2 98%   BMI 25.06 kg/m  General:  well developed, well nourished, in no apparent distress Heart: RRR, no bruits, no LE edema Lungs: clear to auscultation, no accessory muscle use Psych: well oriented with normal range of affect and appropriate judgment/insight  Essential hypertension - Plan: lisinopril (ZESTRIL) 40 MG tablet, DISCONTINUED: lisinopril (ZESTRIL) 40 MG tablet  Hyperlipidemia, unspecified hyperlipidemia type  Gastroesophageal reflux disease, unspecified whether esophagitis present  Chronic, stable. Cont Toprol XL 100 mg/d, lisinopril 40 mg/d. Counseled on diet and exercise. Cont pravastatin 40 mg/d.  Cont protonix 20 mg/d.  Covid booster rec'd.  F/u in 6 mo. The patient voiced understanding and agreement to the plan.  [place], DO 04/05/21  9:15 AM

## 2021-04-05 NOTE — Patient Instructions (Signed)
Keep the diet clean and stay active.  Aim to do some physical exertion for 150 minutes per week. This is typically divided into 5 days per week, 30 minutes per day. The activity should be enough to get your heart rate up. Anything is better than nothing if you have time constraints.  Let us know if you need anything.

## 2021-04-06 ENCOUNTER — Encounter: Payer: Self-pay | Admitting: Family Medicine

## 2021-04-19 DIAGNOSIS — Z23 Encounter for immunization: Secondary | ICD-10-CM | POA: Diagnosis not present

## 2021-04-22 DIAGNOSIS — H40013 Open angle with borderline findings, low risk, bilateral: Secondary | ICD-10-CM | POA: Diagnosis not present

## 2021-05-03 ENCOUNTER — Other Ambulatory Visit: Payer: Self-pay | Admitting: Family Medicine

## 2021-06-14 ENCOUNTER — Other Ambulatory Visit: Payer: Self-pay | Admitting: Family Medicine

## 2021-08-31 DIAGNOSIS — Z23 Encounter for immunization: Secondary | ICD-10-CM | POA: Diagnosis not present

## 2021-10-01 IMAGING — CT CT HEAD W/O CM
3 series · 16 of 47 positions shown, 19 images · non-contrast
Comparison: None.

CLINICAL DATA: Hypertension

EXAM:
CT HEAD WITHOUT CONTRAST
TECHNIQUE: Contiguous axial images were obtained from the base of the skull
through the vertex without intravenous contrast.

[Series 2: head wo · axial · 0.44mm/px · z∈[-146,-21]mm · 10 of 30 slices shown, 13 images]
[im 3/30  brain]
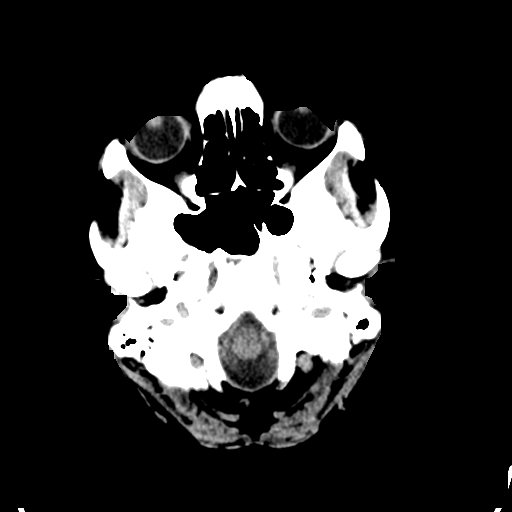
[im 3/30  bone]
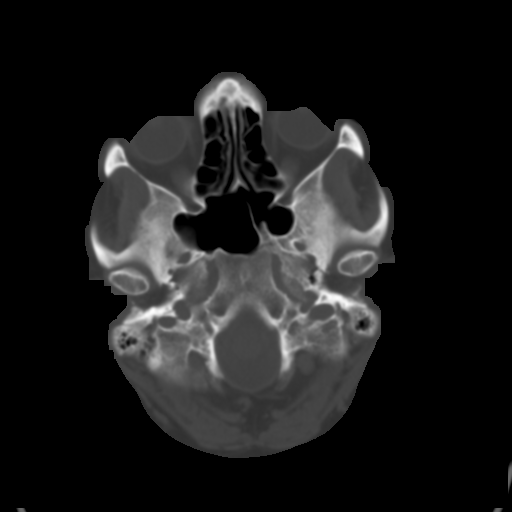
[im 6/30  brain]
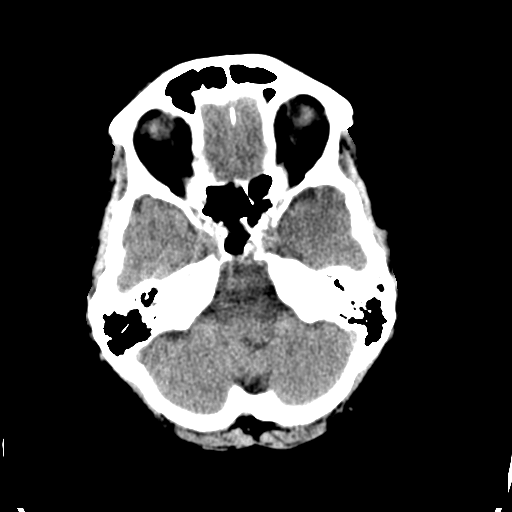
[im 9/30  brain]
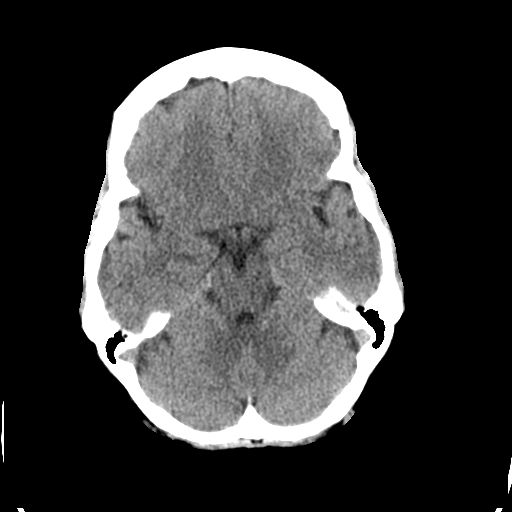
[im 11/30  brain]
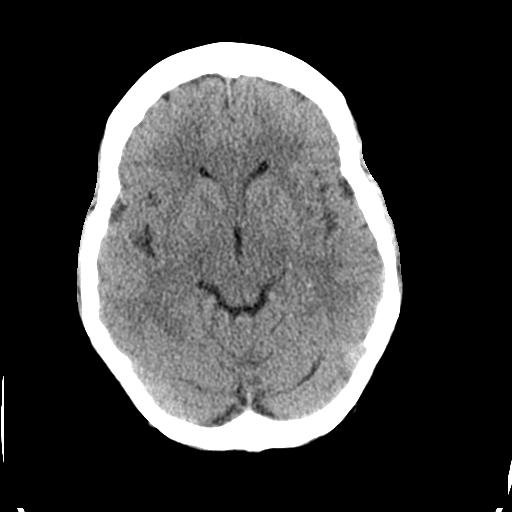
[im 14/30  brain]
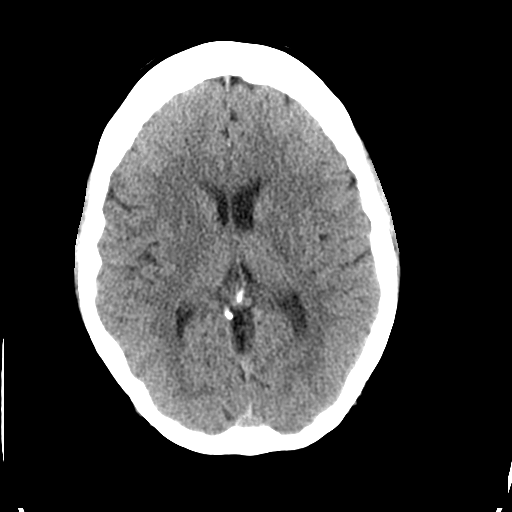
[im 14/30  bone]
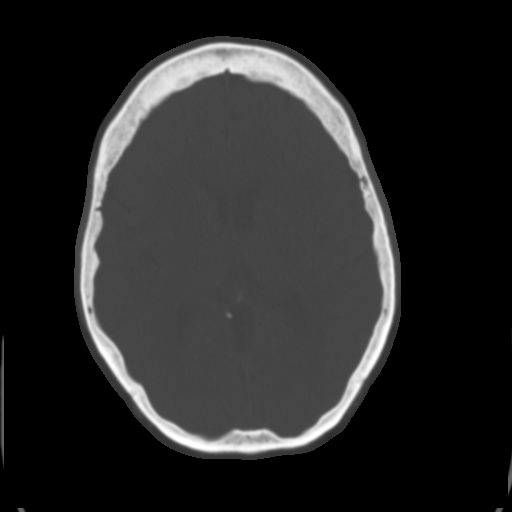
[im 17/30  brain]
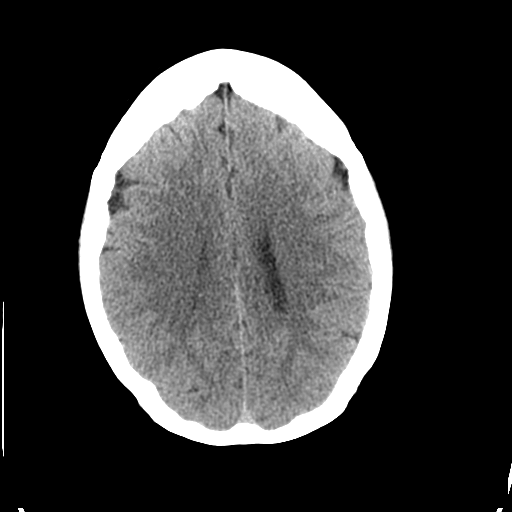
[im 20/30  brain]
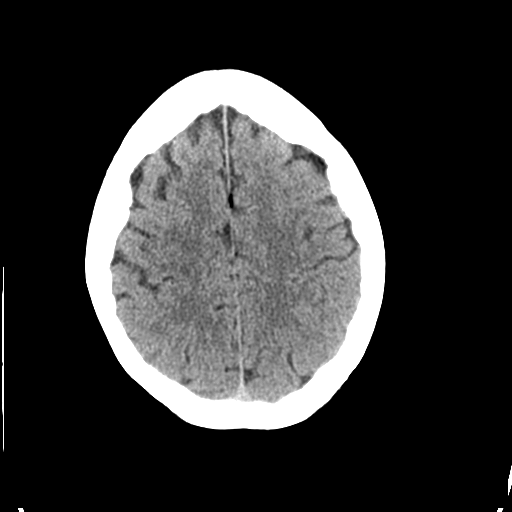
[im 23/30  brain]
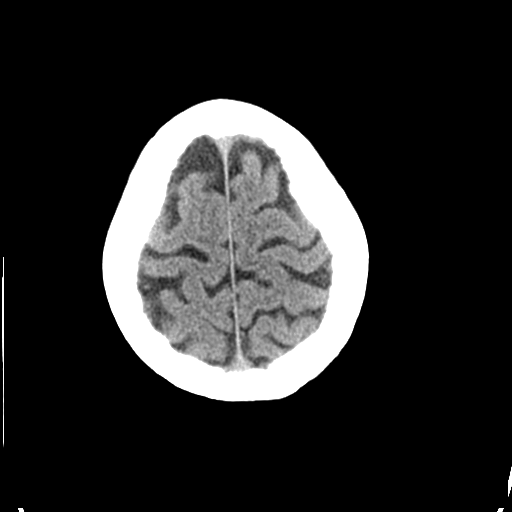
[im 25/30  brain]
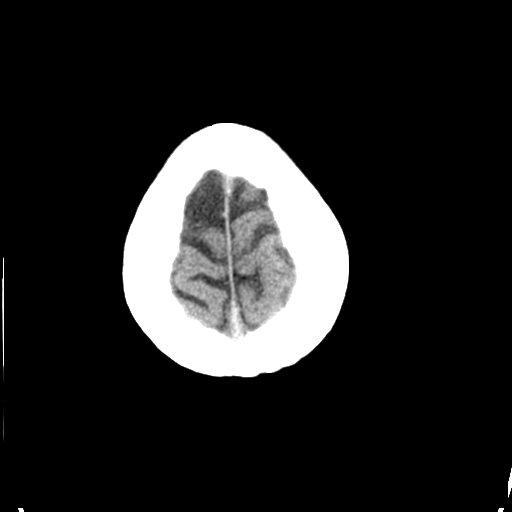
[im 25/30  bone]
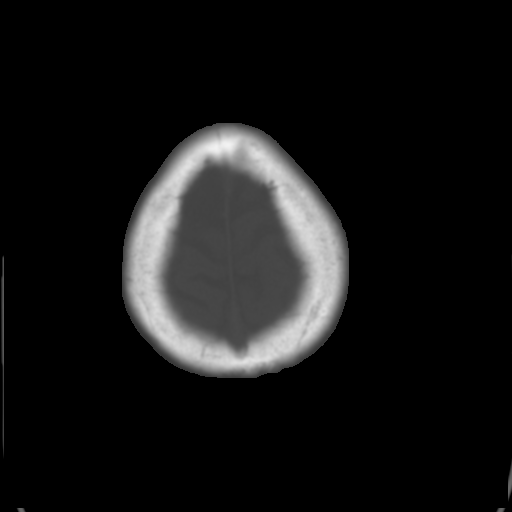
[im 28/30  brain]
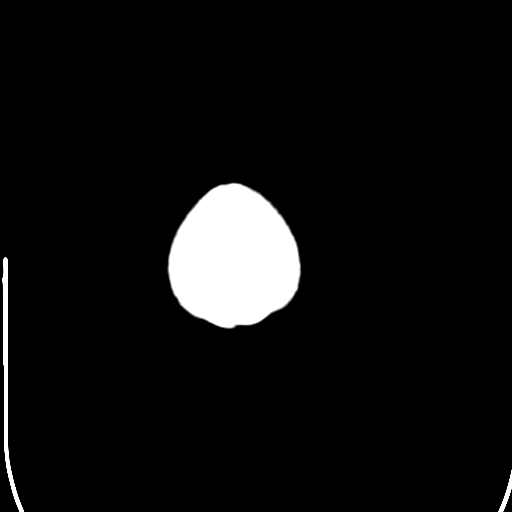

[Series 4: coronal soft tissue · coronal · 0.29mm/px · 3 of 65 slices shown]
[im 22/65  brain]
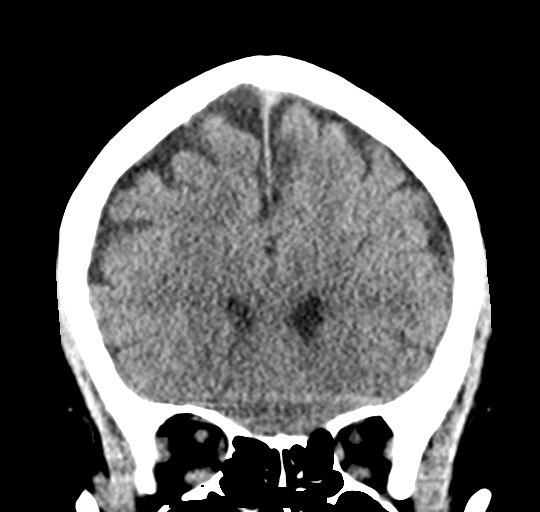
[im 29/65  brain]
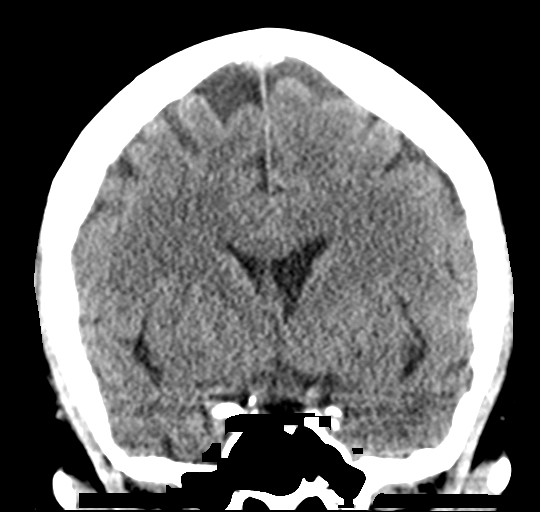
[im 36/65  brain]
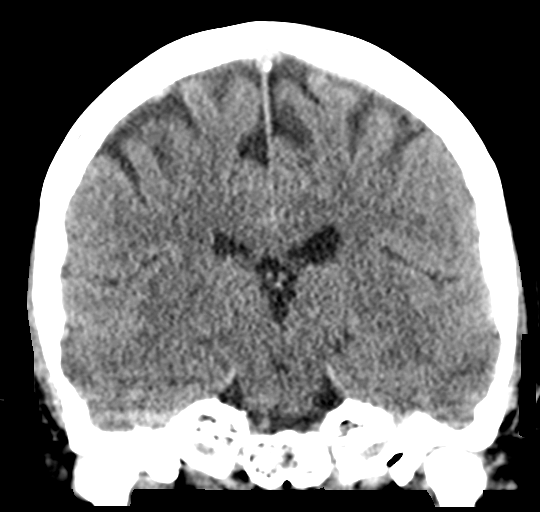

[Series 5: sagittal soft tissue · sagittal · 0.29mm/px · 3 of 52 slices shown]
[im 18/52  brain]
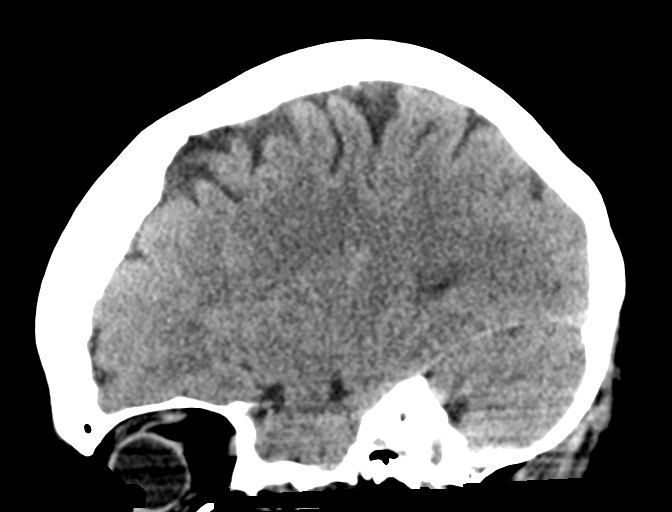
[im 26/52  brain]
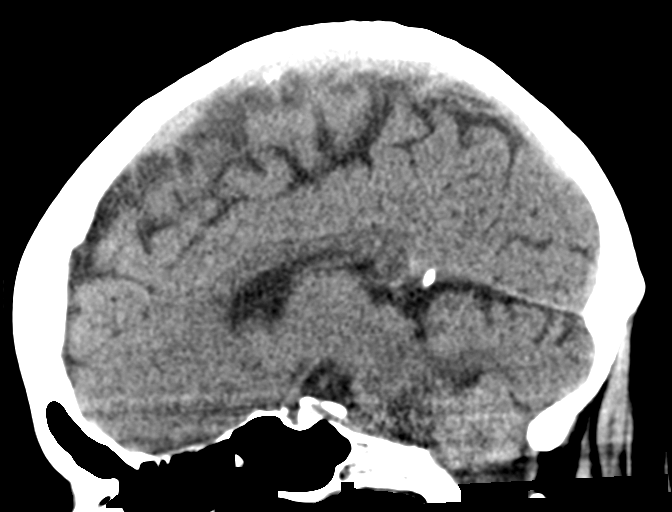
[im 35/52  brain]
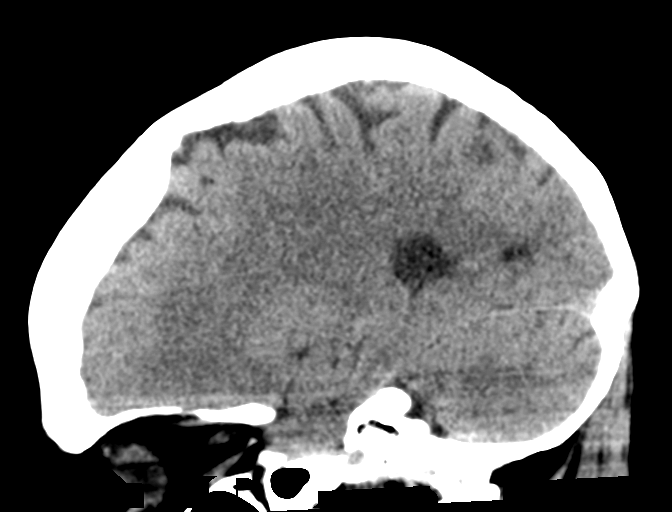

[16 of 47 positions shown; findings below may reference images not displayed]

FINDINGS: Brain: There is no acute intracranial hemorrhage, mass effect, or
edema. Gray-white differentiation is preserved. There is no
extra-axial fluid collection. Ventricles and sulci are within normal
limits in size and configuration.

Vascular: There is minor atherosclerotic calcification at the skull
base.

Skull: Calvarium is unremarkable.

Sinuses/Orbits: No acute finding.

Other: None.
IMPRESSION: No acute intracranial abnormality.

## 2021-10-04 ENCOUNTER — Ambulatory Visit (INDEPENDENT_AMBULATORY_CARE_PROVIDER_SITE_OTHER): Payer: Medicare Other | Admitting: Family Medicine

## 2021-10-04 ENCOUNTER — Encounter: Payer: Self-pay | Admitting: Family Medicine

## 2021-10-04 VITALS — BP 138/80 | HR 73 | Temp 98.3°F | Ht 62.0 in | Wt 135.2 lb

## 2021-10-04 DIAGNOSIS — R7303 Prediabetes: Secondary | ICD-10-CM | POA: Diagnosis not present

## 2021-10-04 DIAGNOSIS — I10 Essential (primary) hypertension: Secondary | ICD-10-CM | POA: Diagnosis not present

## 2021-10-04 DIAGNOSIS — E785 Hyperlipidemia, unspecified: Secondary | ICD-10-CM

## 2021-10-04 DIAGNOSIS — J310 Chronic rhinitis: Secondary | ICD-10-CM | POA: Diagnosis not present

## 2021-10-04 LAB — LIPID PANEL
Cholesterol: 198 mg/dL (ref 0–200)
HDL: 55.8 mg/dL (ref >39.00)
LDL Cholesterol: 117 mg/dL — ABNORMAL HIGH (ref 0–99)
NonHDL: 141.79
Total CHOL/HDL Ratio: 4
Triglycerides: 123 mg/dL (ref 0.0–149.0)
VLDL: 24.6 mg/dL (ref 0.0–40.0)

## 2021-10-04 LAB — COMPREHENSIVE METABOLIC PANEL
ALT: 10 U/L (ref 0–35)
AST: 16 U/L (ref 0–37)
Albumin: 4.1 g/dL (ref 3.5–5.2)
Alkaline Phosphatase: 61 U/L (ref 39–117)
BUN: 20 mg/dL (ref 6–23)
CO2: 28 mEq/L (ref 19–32)
Calcium: 9.7 mg/dL (ref 8.4–10.5)
Chloride: 106 mEq/L (ref 96–112)
Creatinine, Ser: 1.09 mg/dL (ref 0.40–1.20)
GFR: 52 mL/min — ABNORMAL LOW (ref >60.00)
Glucose, Bld: 92 mg/dL (ref 70–99)
Potassium: 4.3 mEq/L (ref 3.5–5.1)
Sodium: 142 mEq/L (ref 135–145)
Total Bilirubin: 0.4 mg/dL (ref 0.2–1.2)
Total Protein: 6.8 g/dL (ref 6.0–8.3)

## 2021-10-04 LAB — HEMOGLOBIN A1C: Hgb A1c MFr Bld: 6 % (ref 4.6–6.5)

## 2021-10-04 MED ORDER — LEVOCETIRIZINE DIHYDROCHLORIDE 5 MG PO TABS: 5.0000 mg | ORAL_TABLET | Freq: Every evening | ORAL | 7 refills | Status: DC

## 2021-10-04 NOTE — Patient Instructions (Addendum)
Keep the diet clean and stay active.  Aim to do some physical exertion for 150 minutes per week. This is typically divided into 5 days per week, 30 minutes per day. The activity should be enough to get your heart rate up. Anything is better than nothing if you have time constraints.  Give [place] 2-3 business days to get the results of your labs back.   I recommend getting the updated bivalent covid vaccination booster at your convenience.   Triple antibiotic ointment twice daily in your nose when you have nosebleeds. Air humidifiers can help.  We are changing your Zyrtec to Xyzal (levocetirizine).   Let us know if you need anything.

## 2021-10-04 NOTE — Progress Notes (Signed)
Chief Complaint  Patient presents with   Follow-up    6 month Nose bleeds    Subjective Kelsey Stanley is a 70 y.o. female who presents for hypertension follow up. She does not monitor home blood pressures. She is compliant with medications- lisinopril 40 mg/d, Toprol XL 100 mg/d. Patient has these side effects of medication: none She is usually adhering to a healthy diet overall. Current exercise: walking No CP or SOB.  Hyperlipidemia Patient presents for dyslipidemia follow up. Currently being treated with pravastatin 40 mg/d and compliance with treatment thus far has been good. She denies myalgias. Diet/exercise as above.  The patient is not known to have coexisting coronary artery disease.   Past Medical History:  Diagnosis Date   GERD (gastroesophageal reflux disease)    Hyperlipidemia    Hypertension     Exam BP 138/80    Pulse 73    Temp 98.3 F (36.8 C) (Oral)    Ht 5\' 2"  (1.575 m)    Wt 135 lb 4 oz (61.3 kg)    SpO2 99%    BMI 24.74 kg/m  General:  well developed, well nourished, in no apparent distress Heart: RRR, no bruits, no LE edema Lungs: clear to auscultation, no accessory muscle use Psych: well oriented with normal range of affect and appropriate judgment/insight  Essential hypertension  Hyperlipidemia, unspecified hyperlipidemia type - Plan: Comprehensive metabolic panel, Lipid panel  Chronic rhinitis - Plan: levocetirizine (XYZAL) 5 MG tablet  Prediabetes - Plan: Hemoglobin A1c  Chronic, stable. Cont lisinopril 40 mg/d, Toprol XL 100 mg/d. Counseled on diet and exercise. Chronic, stable. Cont pravastatin 40 mg/d.  Change Zyrtec to Xyzal. Ck A1c.  Covid bivalent booster rec'd.  F/u in 6 mo. The patient voiced understanding and agreement to the plan.  [place], DO 10/04/21  10:15 AM

## 2021-11-05 DIAGNOSIS — H40011 Open angle with borderline findings, low risk, right eye: Secondary | ICD-10-CM | POA: Diagnosis not present

## 2021-11-05 DIAGNOSIS — H40012 Open angle with borderline findings, low risk, left eye: Secondary | ICD-10-CM | POA: Diagnosis not present

## 2021-11-09 ENCOUNTER — Encounter: Payer: Self-pay | Admitting: Family Medicine

## 2021-11-09 DIAGNOSIS — J310 Chronic rhinitis: Secondary | ICD-10-CM

## 2021-11-10 ENCOUNTER — Ambulatory Visit: Payer: Medicare Other | Admitting: Family Medicine

## 2021-11-10 ENCOUNTER — Other Ambulatory Visit: Payer: Self-pay

## 2021-11-10 ENCOUNTER — Emergency Department (HOSPITAL_BASED_OUTPATIENT_CLINIC_OR_DEPARTMENT_OTHER)
Admission: EM | Admit: 2021-11-10 | Discharge: 2021-11-10 | Disposition: A | Discharge status: Home / Self Care | Payer: Medicare Other | Attending: Emergency Medicine | Admitting: Emergency Medicine

## 2021-11-10 ENCOUNTER — Encounter (HOSPITAL_BASED_OUTPATIENT_CLINIC_OR_DEPARTMENT_OTHER): Payer: Self-pay | Admitting: Emergency Medicine

## 2021-11-10 DIAGNOSIS — Z79899 Other long term (current) drug therapy: Secondary | ICD-10-CM | POA: Diagnosis not present

## 2021-11-10 DIAGNOSIS — I160 Hypertensive urgency: Secondary | ICD-10-CM | POA: Diagnosis not present

## 2021-11-10 DIAGNOSIS — I10 Essential (primary) hypertension: Secondary | ICD-10-CM | POA: Insufficient documentation

## 2021-11-10 LAB — BASIC METABOLIC PANEL
Anion gap: 8 (ref 5–15)
BUN: 17 mg/dL (ref 8–23)
CO2: 24 mmol/L (ref 22–32)
Calcium: 9.1 mg/dL (ref 8.9–10.3)
Chloride: 108 mmol/L (ref 98–111)
Creatinine, Ser: 1.06 mg/dL — ABNORMAL HIGH (ref 0.44–1.00)
GFR, Estimated: 57 mL/min — ABNORMAL LOW (ref >60)
Glucose, Bld: 113 mg/dL — ABNORMAL HIGH (ref 70–99)
Potassium: 4 mmol/L (ref 3.5–5.1)
Sodium: 140 mmol/L (ref 135–145)

## 2021-11-10 LAB — CBC WITH DIFFERENTIAL/PLATELET
Abs Immature Granulocytes: 0.02 10*3/uL (ref 0.00–0.07)
Basophils Absolute: 0 10*3/uL (ref 0.0–0.1)
Basophils Relative: 0 %
Eosinophils Absolute: 0.1 10*3/uL (ref 0.0–0.5)
Eosinophils Relative: 1 %
HCT: 37.9 % (ref 36.0–46.0)
Hemoglobin: 12.6 g/dL (ref 12.0–15.0)
Immature Granulocytes: 0 %
Lymphocytes Relative: 21 %
Lymphs Abs: 1.5 10*3/uL (ref 0.7–4.0)
MCH: 28.3 pg (ref 26.0–34.0)
MCHC: 33.2 g/dL (ref 30.0–36.0)
MCV: 85.2 fL (ref 80.0–100.0)
Monocytes Absolute: 0.6 10*3/uL (ref 0.1–1.0)
Monocytes Relative: 8 %
Neutro Abs: 4.8 10*3/uL (ref 1.7–7.7)
Neutrophils Relative %: 70 %
Platelets: 260 10*3/uL (ref 150–400)
RBC: 4.45 MIL/uL (ref 3.87–5.11)
RDW: 11.8 % (ref 11.5–15.5)
WBC: 6.9 10*3/uL (ref 4.0–10.5)
nRBC: 0 % (ref 0.0–0.2)

## 2021-11-10 MED ORDER — LEVOCETIRIZINE DIHYDROCHLORIDE 5 MG PO TABS: 5.0000 mg | ORAL_TABLET | Freq: Every evening | ORAL | 1 refills | Status: DC

## 2021-11-10 NOTE — Discharge Instructions (Addendum)
Continue taking her high blood pressure medicine at home. Follow-up with your primary if the blood pressure continues to be elevated. Return if you have chest pain, headache, shortness of breath.

## 2021-11-10 NOTE — ED Provider Notes (Signed)
[facility]PART[facility] Provider Note   CSN: [id number] Arrival date & time: 11/10/21  1041     History  Chief Complaint  Patient presents with   Hypertension    Kelsey Stanley is a 71 y.o. female.   Hypertension Pertinent negatives include no chest pain.   Patient with history of GERD, hypertension, hyperlipidemia presents due to elevated blood pressure reading.  Patient was at the dentist.  To have an implant, patient was numbed to the left side of her jaw but blood pressure was elevated and stayed elevated so dentist discontinue the procedure and sent her to the ED for evaluation.  She is symptom-free, denies any chest pain, shortness of breath, weakness, severe headaches, vision changes.  She is medically adherent and has not missed any doses.  Takes 40 of lisinopril and 100 of metoprolol daily.  Home Medications Prior to Admission medications   Medication Sig Start Date End Date Taking? Authorizing Provider  cholecalciferol (VITAMIN D) 400 units TABS tablet Take 400 Units by mouth daily.     [provider]  levocetirizine (XYZAL) 5 MG tablet Take 1 tablet (5 mg total) by mouth every evening. 11/10/21   Shelda Pal, DO  lisinopril (ZESTRIL) 40 MG tablet Take 1 tablet (40 mg total) by mouth daily. 04/05/21   Shelda Pal, DO  metoprolol succinate (TOPROL-XL) 100 MG 24 hr tablet TAKE 1 TABLET  DAILY. TAKE WITH OR IMMEDIATELY FOLLOWING A MEAL. 05/03/21   Shelda Pal, DO  pantoprazole (PROTONIX) 20 MG tablet TAKE 1 TABLET EVERY DAY 06/14/21   Shelda Pal, DO  pravastatin (PRAVACHOL) 40 MG tablet TAKE 1 TABLET EVERY DAY 06/14/21   Shelda Pal, DO  Probiotic Product (PROBIOTIC-10 PO) Take 1 capsule by mouth daily.     [provider]  valACYclovir (VALTREX) 1000 MG tablet Take 2 tabs and repeat in 12 hours in event of an outbreak. 04/05/21   Shelda Pal, DO      Allergies     Morphine and related, Amoxicillin, and Erythromycin    Review of Systems   Review of Systems  Constitutional:        Elevated HTN   Cardiovascular:  Negative for chest pain.   Physical Exam Updated Vital Signs BP (!) 170/73    Pulse (!) 59    Temp 97.8 F (36.6 C) (Oral)    Resp 14    Ht 5\' 2"  (1.575 m)    Wt 59 kg    SpO2 99%    BMI 23.78 kg/m  Physical Exam Vitals and nursing note reviewed. Exam conducted with a chaperone present.  Constitutional:      Appearance: Normal appearance.  HENT:     Head: Normocephalic and atraumatic.  Eyes:     General: No scleral icterus.       Right eye: No discharge.        Left eye: No discharge.     Extraocular Movements: Extraocular movements intact.     Pupils: Pupils are equal, round, and reactive to light.  Cardiovascular:     Rate and Rhythm: Normal rate and regular rhythm.     Pulses: Normal pulses.     Heart sounds: Normal heart sounds. No murmur heard.   No friction rub. No gallop.  Pulmonary:     Effort: Pulmonary effort is normal. No respiratory distress.     Breath sounds: Normal breath sounds.  Abdominal:     General: Abdomen is  flat. Bowel sounds are normal. There is no distension.     Palpations: Abdomen is soft.     Tenderness: There is no abdominal tenderness.  Skin:    General: Skin is warm and dry.     Coloration: Skin is not jaundiced.  Neurological:     Mental Status: She is alert. Mental status is at baseline.     Coordination: Coordination normal.     Comments: Asymmetric smile secondary to dental block.  Otherwise no focal deficits, grip strength equal bilaterally.    ED Results / Procedures / Treatments   Labs (all labs ordered are listed, but only abnormal results are displayed) Labs Reviewed  BASIC METABOLIC PANEL - Abnormal; Notable for the following components:      Result Value   Glucose, Bld 113 (*)    Creatinine, Ser 1.06 (*)    GFR, Estimated 57 (*)    All other components within normal limits   CBC WITH DIFFERENTIAL/PLATELET    EKG EKG Interpretation  Date/Time:  Wednesday November 10 2021 11:17:11 EST Ventricular Rate:  69 PR Interval:  141 QRS Duration: 101 QT Interval:  378 QTC Calculation: 405 R Axis:   11 Text Interpretation: Sinus rhythm Borderline T abnormalities, anterior leads biphasic t wave in v3 flipped t wave lead III Otherwise no significant change Confirmed by Deno Etienne [phone]) on 11/10/2021 12:03:50 PM  Radiology No results found.  Procedures Procedures    Medications Ordered in ED Medications - No data to display  ED Course/ Medical Decision Making/ A&P                           Medical Decision Making Amount and/or Complexity of Data Reviewed Labs: ordered.   This patient presents to the ED for concern of asymptomatic hypertension, this involves an extensive number of treatment options, and is a complaint that carries with it a high risk of complications and morbidity.  The differential diagnosis includes HTN  emergency, renal failure, SAH, CVA/TIA< anxiety, pain, other   Additional history obtained: -Additional history obtained from patient's spouse who is at bedside -External records from outside source obtained and reviewed including: Chart review including previous notes, labs, imaging, consultation notes   Lab Tests: -I ordered, reviewed, and interpreted labs.  The pertinent results include: No AKI, creatinine unchanged compared to prior.   EKG -Biphasic T wave unilaterally, T wave inversion lead III.  Doubt ACS, no underlying arrhythmia noted    ED Course: Patient asymptomatic with hypertension.  Systolic was greater than 210 on arrival, BMP does not show any signs of renal impairment.  Creatinine is at baseline.  No gross electrolyte derangement.  Do not feel she needs additional work-up or admission at this time given asymptomatic hypertension without any signs of endorgan damage.     Discussed HPI, physical exam and plan of  care for this patient with attending Deno Etienne. The attending physician evaluated this patient as part of a shared visit and agrees with plan of care.    Cardiac Monitoring: The patient was maintained on a cardiac monitor.  I personally viewed and interpreted the cardiac monitored which showed an underlying rhythm of: NSR   Reevaluation: After the interventions noted above, I reevaluated the patient and found that they have :improved   Dispostion:  Advised to continue following up with her primary care and taking her medicine as prescribed.  Return precautions given, patient discharged in stable condition.  Final Clinical Impression(s) / ED Diagnoses Final diagnoses:  Hypertensive urgency    Rx / DC Orders ED Discharge Orders     None         Sherrill Raring, PA-C 11/10/21 [place], DO 11/10/21 1339

## 2021-11-10 NOTE — ED Triage Notes (Signed)
Sent from the dentist office for HTN. They would not do her procedure. She has a history of HTN and takes her BP meds as prescribed. Pt states she does not feel bad.

## 2021-11-12 DIAGNOSIS — D1801 Hemangioma of skin and subcutaneous tissue: Secondary | ICD-10-CM | POA: Diagnosis not present

## 2021-11-12 DIAGNOSIS — L821 Other seborrheic keratosis: Secondary | ICD-10-CM | POA: Diagnosis not present

## 2021-11-12 DIAGNOSIS — C44629 Squamous cell carcinoma of skin of left upper limb, including shoulder: Secondary | ICD-10-CM | POA: Diagnosis not present

## 2021-11-12 DIAGNOSIS — L814 Other melanin hyperpigmentation: Secondary | ICD-10-CM | POA: Diagnosis not present

## 2021-11-12 DIAGNOSIS — D2261 Melanocytic nevi of right upper limb, including shoulder: Secondary | ICD-10-CM | POA: Diagnosis not present

## 2021-11-12 DIAGNOSIS — Z85828 Personal history of other malignant neoplasm of skin: Secondary | ICD-10-CM | POA: Diagnosis not present

## 2021-11-12 DIAGNOSIS — D225 Melanocytic nevi of trunk: Secondary | ICD-10-CM | POA: Diagnosis not present

## 2021-11-12 DIAGNOSIS — D220 Melanocytic nevi of lip: Secondary | ICD-10-CM | POA: Diagnosis not present

## 2021-11-17 ENCOUNTER — Encounter: Payer: Self-pay | Admitting: Family Medicine

## 2021-11-17 ENCOUNTER — Ambulatory Visit (INDEPENDENT_AMBULATORY_CARE_PROVIDER_SITE_OTHER): Payer: Medicare Other | Admitting: Family Medicine

## 2021-11-17 VITALS — BP 158/74 | HR 81 | Temp 98.0°F | Ht 62.0 in | Wt 133.1 lb

## 2021-11-17 DIAGNOSIS — I10 Essential (primary) hypertension: Secondary | ICD-10-CM | POA: Diagnosis not present

## 2021-11-17 MED ORDER — AMLODIPINE BESYLATE 5 MG PO TABS: 5.0000 mg | ORAL_TABLET | Freq: Every day | ORAL | 3 refills | Status: DC

## 2021-11-17 NOTE — Progress Notes (Signed)
Chief Complaint  Patient presents with   Follow-up    ER Hypertension Dental Clearance    Subjective Kelsey Stanley is a 71 y.o. female who presents for hypertension follow up. She does monitor home blood pressures. Blood pressures ranging from 140-150's/90's on average. She is compliant with medications- lisinopril 40 mg/d, Toprol XL 100 mg/d. Patient has these side effects of medication: none She is sometimes adhering to a healthy diet overall. Current exercise: walking No Cp or SOB.    Past Medical History:  Diagnosis Date   GERD (gastroesophageal reflux disease)    Hyperlipidemia    Hypertension     Exam BP (!) 158/74 (BP Location: Left Arm, Cuff Size: Normal)    Pulse 81    Temp 98 F (36.7 C) (Oral)    Ht 5\' 2"  (1.575 m)    Wt 133 lb 2 oz (60.4 kg)    SpO2 99%    BMI 24.35 kg/m  General:  well developed, well nourished, in no apparent distress Heart: RRR, no bruits, no LE edema Lungs: clear to auscultation, no accessory muscle use Psych: well oriented with normal range of affect and appropriate judgment/insight  Essential hypertension - Plan: amLODipine (NORVASC) 5 MG tablet  Chronic, unstable.  Add amlodipine 5 mg daily.  Continue lisinopril 40 mg daily and Toprol-XL 100 mg daily.  Continue to monitor blood pressure at home.  Counseled on diet and exercise. F/u in 1 month. The patient voiced understanding and agreement to the plan.  [place], DO 11/17/21  10:26 AM

## 2021-11-17 NOTE — Patient Instructions (Signed)
Keep the diet clean and stay active.  Check your blood pressures 2-3 times per week, alternating the time of day you check it. If it is high, considering waiting 1-2 minutes and rechecking. If it gets higher, your anxiety is likely creeping up and we should avoid rechecking.   Continue the lisinopril and Toprol.  Let us know if you need anything.

## 2021-12-09 ENCOUNTER — Ambulatory Visit: Payer: Medicare Other

## 2021-12-09 NOTE — Progress Notes (Signed)
Subjective:   Kelsey Stanley is a 71 y.o. female who presents for Medicare Annual (Subsequent) preventive examination.  Review of Systems    Cardiac Risk Factors include: hypertension;advanced age (>80men, >72 women);dyslipidemia     Objective:    Today's Vitals   12/10/21 0859  BP: 122/64  Pulse: 89  Resp: 16  Temp: 97.9 F (36.6 C)  TempSrc: Oral  SpO2: 96%  Weight: 133 lb (60.3 kg)  Height: 5\' 2"  (1.575 m)   Body mass index is 24.33 kg/m.  Advanced Directives 12/10/2021 11/10/2021 12/03/2020 05/07/2020 05/07/2020 10/04/2019 10/02/2018  Does Patient Have a Medical Advance Directive? No No No No No No No  Would patient like information on creating a medical advance directive? No - Patient declined - No - Patient declined No - Patient declined No - Patient declined No - Patient declined No - Patient declined    Current Medications (verified) Outpatient Encounter Medications as of 12/10/2021  Medication Sig   cholecalciferol (VITAMIN D) 400 units TABS tablet Take 400 Units by mouth daily.    levocetirizine (XYZAL) 5 MG tablet Take 1 tablet (5 mg total) by mouth every evening.   lisinopril (ZESTRIL) 40 MG tablet Take 1 tablet (40 mg total) by mouth daily.   metoprolol succinate (TOPROL-XL) 100 MG 24 hr tablet TAKE 1 TABLET  DAILY. TAKE WITH OR IMMEDIATELY FOLLOWING A MEAL.   pantoprazole (PROTONIX) 20 MG tablet TAKE 1 TABLET EVERY DAY   pravastatin (PRAVACHOL) 40 MG tablet TAKE 1 TABLET EVERY DAY   Probiotic Product (PROBIOTIC-10 PO) Take 1 capsule by mouth daily.    valACYclovir (VALTREX) 1000 MG tablet Take 2 tabs and repeat in 12 hours in event of an outbreak.   [DISCONTINUED] amLODipine (NORVASC) 5 MG tablet Take 1 tablet (5 mg total) by mouth daily.   No facility-administered encounter medications on file as of 12/10/2021.    Allergies (verified) Morphine and related, Amoxicillin, and Erythromycin   History: Past Medical History:  Diagnosis Date   GERD  (gastroesophageal reflux disease)    Hyperlipidemia    Hypertension    Past Surgical History:  Procedure Laterality Date   ABDOMINAL HYSTERECTOMY  1993   BREAST BIOPSY Right 1994   CHOLECYSTECTOMY, LAPAROSCOPIC  1993   Family History  Problem Relation Age of Onset   Diabetes Mother    Hyperlipidemia Mother    Hypertension Mother    Glaucoma Mother    Cancer Mother        bladder cancer   Heart disease Father    Diabetes Sister    Hypertension Sister    Hyperlipidemia Brother    Hypertension Brother    Diabetes Sister    Hypertension Sister    Social History   Socioeconomic History   Marital status: Married    Spouse name: Not on file   Number of children: Not on file   Years of education: Not on file   Highest education level: Not on file  Occupational History   Not on file  Tobacco Use   Smoking status: Never   Smokeless tobacco: Never  Substance and Sexual Activity   Alcohol use: Never   Drug use: Never   Sexual activity: Yes  Other Topics Concern   Not on file  Social History Narrative   Not on file   Social Determinants of Health   Financial Resource Strain: Low Risk    Difficulty of Paying Living Expenses: Not hard at all  Food Insecurity: No Food Insecurity  Worried About Charity fundraiser in the Last Year: Never true   [place] in the Last Year: Never true  Transportation Needs: No Transportation Needs   Lack of Transportation (Medical): No   Lack of Transportation (Non-Medical): No  Physical Activity: Sufficiently Active   Days of Exercise per Week: 7 days   Minutes of Exercise per Session: 30 min  Stress: No Stress Concern Present   Feeling of Stress : Not at all  Social Connections: Moderately Integrated   Frequency of Communication with Friends and Family: More than three times a week   Frequency of Social Gatherings with Friends and Family: Once a week   Attends Religious Services: More than 4 times per year   Active Member of  [employer] or Organizations: No   Attends Music therapist: Never   Marital Status: Married    Tobacco Counseling Counseling given: Not Answered   Clinical Intake:  Pre-visit preparation completed: Yes  Pain : No/denies pain     BMI - recorded: 24.33 Nutritional Status: BMI of 19-24  Normal Nutritional Risks: None Diabetes: No  How often do you need to have someone help you when you read instructions, pamphlets, or other written materials from your doctor or pharmacy?: 1 - Never  Diabetic?No  Interpreter Needed?: No      Activities of Daily Living In your present state of health, do you have any difficulty performing the following activities: 12/10/2021  Hearing? N  Vision? N  Difficulty concentrating or making decisions? N  Walking or climbing stairs? N  Dressing or bathing? N  Doing errands, shopping? N  Preparing Food and eating ? N  Using the Toilet? N  In the past six months, have you accidently leaked urine? N  Do you have problems with loss of bowel control? N  Managing your Medications? N  Managing your Finances? N  Housekeeping or managing your Housekeeping? N  Some recent data might be hidden    Patient Care Team: Shelda Pal, DO as PCP - General (Family Medicine)  Indicate any recent Medical Services you may have received from other than [facility] providers in the past year (date may be approximate).     Assessment:   This is a routine wellness examination for [place].  Hearing/Vision screen Hearing Screening - Comments:: Good Vision Screening - Comments:: Pt sees Dr. Lorrin Jackson  Dietary issues and exercise activities discussed: Exercise limited by: None identified   Goals Addressed             This Visit's Progress    Increase physical activity   Not on track    Do exercise videos       Depression Screen PHQ 2/9 Scores 12/10/2021 04/05/2021 12/03/2020 10/04/2019 10/02/2018  PHQ - 2 Score 0 0 0 0 0    Fall Risk Fall  Risk  12/10/2021 04/05/2021 12/03/2020 10/04/2019 10/02/2018  Falls in the past year? 0 0 0 0 0  Number falls in past yr: 0 0 0 - -  Injury with Fall? 0 0 0 - -  Risk for fall due to : No Fall Risks No Fall Risks - - -  Follow up Falls prevention discussed Falls evaluation completed Falls prevention discussed Education provided;Falls prevention discussed -    FALL RISK PREVENTION PERTAINING TO THE HOME:  Any stairs in or around the home? No Home free of loose throw rugs in walkways, pet beds, electrical cords, etc? Yes Adequate lighting in your home to  reduce risk of falls? Yes   ASSISTIVE DEVICES UTILIZED TO PREVENT FALLS:  Life alert? No  Use of a cane, walker or w/c? No  Grab bars in the bathroom? No  Shower chair or bench in shower? No  Elevated toilet seat or a handicapped toilet? Yes   TIMED UP AND GO:  Was the test performed? Yes .  Length of time to ambulate 10 feet: 9 sec.   Gait steady and fast without use of assistive device  Cognitive Function:Normal cognitive status assessed by direct observation by this Nurse Health Advisor. No abnormalities found.          Immunizations Immunization History  Administered Date(s) Administered   Fluad Quad(high Dose 65+) 07/18/2019, 07/31/2020   Influenza, High Dose Seasonal PF 09/15/2021   Influenza-Unspecified 08/09/2016, 06/19/2018   PFIZER(Purple Top)SARS-COV-2 Vaccination 11/06/2019, 11/27/2019, 08/17/2020   Pneumococcal Conjugate-13 06/19/2018   Pneumococcal Polysaccharide-23 12/03/2020   Tdap 10/02/2018   Zoster Recombinat (Shingrix) 01/16/2019, 06/18/2019   Zoster, Live 07/18/2013    TDAP status: Up to date  Flu Vaccine status: Up to date  Pneumococcal vaccine status: Up to date  Covid-19 vaccine status: Declined, Education has been provided regarding the importance of this vaccine but patient still declined. Advised may receive this vaccine at local pharmacy or Health Dept.or vaccine clinic. Aware to provide a  copy of the vaccination record if obtained from local pharmacy or Health Dept. Verbalized acceptance and understanding.  Qualifies for Shingles Vaccine? No   Zostavax completed Yes   Shingrix Completed?: Yes  Screening Tests Health Maintenance  Topic Date Due   MAMMOGRAM  03/30/2023   COLONOSCOPY (Pts 45-95yrs Insurance coverage will need to be confirmed)  06/17/2028   TETANUS/TDAP  10/02/2028   Pneumonia Vaccine 29+ Years old  Completed   INFLUENZA VACCINE  Completed   DEXA SCAN  Completed   Hepatitis C Screening  Completed   Zoster Vaccines- Shingrix  Completed   HPV VACCINES  Aged Out   COVID-19 Vaccine  Discontinued    Health Maintenance  There are no preventive care reminders to display for this patient.   Colorectal cancer screening: Type of screening: Colonoscopy. Completed 06/17/2018. Repeat every 10 years  Mammogram status: Completed bilateral 03/29/2021. Repeat every year  Bone Density status: Completed 03/29/2021. Results reflect: Bone density results: OSTEOPENIA. Repeat every 2 years.  Lung Cancer Screening: (Low Dose CT Chest recommended if Age 48-80 years, 30 pack-year currently smoking OR have quit w/in 15years.) does not qualify.     Additional Screening:  Hepatitis C Screening: Completed 04/03/2019  Vision Screening: Recommended annual ophthalmology exams for early detection of glaucoma and other disorders of the eye. Is the patient up to date with their annual eye exam?  Yes  Who is the provider or what is the name of the office in which the patient attends annual eye exams? Dr. Idolina Primer If Dental Screening: Recommended annual dental exams for proper oral hygiene  Community Resource Referral / Chronic Care Management: CRR required this visit?  No   CCM required this visit?  No      Plan:     I have personally reviewed and noted the following in the patients chart:   Medical and social history Use of alcohol, tobacco or illicit drugs  Current  medications and supplements including opioid prescriptions.  Functional ability and status Nutritional status Physical activity Advanced directives List of other physicians Hospitalizations, surgeries, and ER visits in previous 12 months Vitals Screenings to include cognitive, depression,  and falls Referrals and appointments  In addition, I have reviewed and discussed with patient certain preventive protocols, quality metrics, and best practice recommendations. A written personalized care plan for preventive services as well as general preventive health recommendations were provided to patient.     Marta Antu, LPN   [birth year]  Nurse health Advisor  Nurse Notes: Pt would like to access AVS on mychart  [place], assisted with completing patient visit today.

## 2021-12-10 ENCOUNTER — Encounter: Payer: Self-pay | Admitting: Family Medicine

## 2021-12-10 ENCOUNTER — Ambulatory Visit (INDEPENDENT_AMBULATORY_CARE_PROVIDER_SITE_OTHER): Payer: Medicare Other | Admitting: Family Medicine

## 2021-12-10 ENCOUNTER — Ambulatory Visit (INDEPENDENT_AMBULATORY_CARE_PROVIDER_SITE_OTHER): Payer: Medicare Other

## 2021-12-10 VITALS — BP 122/64 | HR 89 | Temp 97.9°F | Ht 62.0 in | Wt 133.0 lb

## 2021-12-10 VITALS — BP 122/64 | HR 89 | Temp 97.9°F | Resp 16 | Ht 62.0 in | Wt 133.0 lb

## 2021-12-10 DIAGNOSIS — Z0000 Encounter for general adult medical examination without abnormal findings: Secondary | ICD-10-CM | POA: Diagnosis not present

## 2021-12-10 DIAGNOSIS — I10 Essential (primary) hypertension: Secondary | ICD-10-CM

## 2021-12-10 NOTE — Patient Instructions (Signed)
Ms. Kelsey Stanley , Thank you for taking time to come for your Medicare Wellness Visit. I appreciate your ongoing commitment to your health goals. Please review the following plan we discussed and let me know if I can assist you in the future.   Screening recommendations/referrals: Colonoscopy: 06/17/2018 due 06/17/2028 Mammogram: 03/29/2021 due 03/29/2022 Bone Density: 03/29/2022 Recommended yearly ophthalmology/optometry visit for glaucoma screening and checkup Recommended yearly dental visit for hygiene and checkup  Vaccinations: Influenza vaccine: up to date Pneumococcal vaccine: up to date Tdap vaccine: up to date Shingles vaccine: up to date   Covid-19:Due-May obtain vaccine at  your local pharmacy.   Advanced directives: No, declined packets  Conditions/risks identified: see problem list  Next appointment: Follow up in one year for your annual wellness visit 12/15/2022   Preventive Care 65 Years and Older, Female Preventive care refers to lifestyle choices and visits with your health care provider that can promote health and wellness. What does preventive care include? A yearly physical exam. This is also called an annual well check. Dental exams once or twice a year. Routine eye exams. Ask your health care provider how often you should have your eyes checked. Personal lifestyle choices, including: Daily care of your teeth and gums. Regular physical activity. Eating a healthy diet. Avoiding tobacco and drug use. Limiting alcohol use. Practicing safe sex. Taking low-dose aspirin every day. Taking vitamin and mineral supplements as recommended by your health care provider. What happens during an annual well check? The services and screenings done by your health care provider during your annual well check will depend on your age, overall health, lifestyle risk factors, and family history of disease. Counseling  Your health care provider may ask you questions about your: Alcohol  use. Tobacco use. Drug use. Emotional well-being. Home and relationship well-being. Sexual activity. Eating habits. History of falls. Memory and ability to understand (cognition). Work and work Statistician. Reproductive health. Screening  You may have the following tests or measurements: Height, weight, and BMI. Blood pressure. Lipid and cholesterol levels. These may be checked every 5 years, or more frequently if you are over 37 years old. Skin check. Lung cancer screening. You may have this screening every year starting at age 34 if you have a 30-pack-year history of smoking and currently smoke or have quit within the past 15 years. Fecal occult blood test (FOBT) of the stool. You may have this test every year starting at age 44. Flexible sigmoidoscopy or colonoscopy. You may have a sigmoidoscopy every 5 years or a colonoscopy every 10 years starting at age 76. Hepatitis C blood test. Hepatitis B blood test. Sexually transmitted disease (STD) testing. Diabetes screening. This is done by checking your blood sugar (glucose) after you have not eaten for a while (fasting). You may have this done every 1-3 years. Bone density scan. This is done to screen for osteoporosis. You may have this done starting at age 21. Mammogram. This may be done every 1-2 years. Talk to your health care provider about how often you should have regular mammograms. Talk with your health care provider about your test results, treatment options, and if necessary, the need for more tests. Vaccines  Your health care provider may recommend certain vaccines, such as: Influenza vaccine. This is recommended every year. Tetanus, diphtheria, and acellular pertussis (Tdap, Td) vaccine. You may need a Td booster every 10 years. Zoster vaccine. You may need this after age 67. Pneumococcal 13-valent conjugate (PCV13) vaccine. One dose is recommended after age  65. Pneumococcal polysaccharide (PPSV23) vaccine. One dose is  recommended after age 46. Talk to your health care provider about which screenings and vaccines you need and how often you need them. This information is not intended to replace advice given to you by your health care provider. Make sure you discuss any questions you have with your health care provider. Document Released: 10/30/2015 Document Revised: 06/22/2016 Document Reviewed: 08/04/2015 Elsevier Interactive Patient Education  2017 [place] Prevention in the Home Falls can cause injuries. They can happen to people of all ages. There are many things you can do to make your home safe and to help prevent falls. What can I do on the outside of my home? Regularly fix the edges of walkways and driveways and fix any cracks. Remove anything that might make you trip as you walk through a door, such as a raised step or threshold. Trim any bushes or trees on the path to your home. Use bright outdoor lighting. Clear any walking paths of anything that might make someone trip, such as rocks or tools. Regularly check to see if handrails are loose or broken. Make sure that both sides of any steps have handrails. Any raised decks and porches should have guardrails on the edges. Have any leaves, snow, or ice cleared regularly. Use sand or salt on walking paths during winter. Clean up any spills in your garage right away. This includes oil or grease spills. What can I do in the bathroom? Use night lights. Install grab bars by the toilet and in the tub and shower. Do not use towel bars as grab bars. Use non-skid mats or decals in the tub or shower. If you need to sit down in the shower, use a plastic, non-slip stool. Keep the floor dry. Clean up any water that spills on the floor as soon as it happens. Remove soap buildup in the tub or shower regularly. Attach bath mats securely with double-sided non-slip rug tape. Do not have throw rugs and other things on the floor that can make you  trip. What can I do in the bedroom? Use night lights. Make sure that you have a light by your bed that is easy to reach. Do not use any sheets or blankets that are too big for your bed. They should not hang down onto the floor. Have a firm chair that has side arms. You can use this for support while you get dressed. Do not have throw rugs and other things on the floor that can make you trip. What can I do in the kitchen? Clean up any spills right away. Avoid walking on wet floors. Keep items that you use a lot in easy-to-reach places. If you need to reach something above you, use a strong step stool that has a grab bar. Keep electrical cords out of the way. Do not use floor polish or wax that makes floors slippery. If you must use wax, use non-skid floor wax. Do not have throw rugs and other things on the floor that can make you trip. What can I do with my stairs? Do not leave any items on the stairs. Make sure that there are handrails on both sides of the stairs and use them. Fix handrails that are broken or loose. Make sure that handrails are as long as the stairways. Check any carpeting to make sure that it is firmly attached to the stairs. Fix any carpet that is loose or worn. Avoid having throw rugs at  the top or bottom of the stairs. If you do have throw rugs, attach them to the floor with carpet tape. Make sure that you have a light switch at the top of the stairs and the bottom of the stairs. If you do not have them, ask someone to add them for you. What else can I do to help prevent falls? Wear shoes that: Do not have high heels. Have rubber bottoms. Are comfortable and fit you well. Are closed at the toe. Do not wear sandals. If you use a stepladder: Make sure that it is fully opened. Do not climb a closed stepladder. Make sure that both sides of the stepladder are locked into place. Ask someone to hold it for you, if possible. Clearly mark and make sure that you can  see: Any grab bars or handrails. First and last steps. Where the edge of each step is. Use tools that help you move around (mobility aids) if they are needed. These include: Canes. Walkers. Scooters. Crutches. Turn on the lights when you go into a dark area. Replace any light bulbs as soon as they burn out. Set up your furniture so you have a clear path. Avoid moving your furniture around. If any of your floors are uneven, fix them. If there are any pets around you, be aware of where they are. Review your medicines with your doctor. Some medicines can make you feel dizzy. This can increase your chance of falling. Ask your doctor what other things that you can do to help prevent falls. This information is not intended to replace advice given to you by your health care provider. Make sure you discuss any questions you have with your health care provider. Document Released: 07/30/2009 Document Revised: 03/10/2016 Document Reviewed: 11/07/2014 Elsevier Interactive Patient Education  2017 [employer].

## 2021-12-10 NOTE — Progress Notes (Signed)
Chief Complaint  Patient presents with   Follow-up    Blood pressure     Subjective Kelsey Stanley is a 71 y.o. female who presents for hypertension follow up. She does monitor home blood pressures. Blood pressures ranging from 120-130's/70's on average. She is compliant with medications- Norvasc 5 mg/d, lisinopril 40 mg/d, Toprol XL 100 mg/d. Patient has these side effects of medication: swelling from amlodipine She is adhering to a healthy diet overall. Current exercise: walking No CP or SOB.    Past Medical History:  Diagnosis Date   GERD (gastroesophageal reflux disease)    Hyperlipidemia    Hypertension     Exam BP 122/64    Pulse 89    Temp 97.9 F (36.6 C) (Oral)    Ht 5\' 2"  (1.575 m)    Wt 133 lb (60.3 kg)    SpO2 96%    BMI 24.33 kg/m  General:  well developed, well nourished, in no apparent distress Heart: RRR, no bruits, no LE edema Lungs: clear to auscultation, no accessory muscle use Psych: well oriented with normal range of affect and appropriate judgment/insight  Essential hypertension  AE effect of chronic med. Stop Norvasc. Cont Toprol XL 100 mg/d, lisinopril 40 mg/d. Monitor BP at home. Send readings in 1 mo. If not controlled still, will send in Zestoretic 20-25 mg/d and ck BMP in 1 week after. She would like to keep pill burden low. Counseled on diet and exercise. F/u in 6 mo pending readings. The patient voiced understanding and agreement to the plan.  [place], DO 12/10/21  10:24 AM

## 2021-12-10 NOTE — Patient Instructions (Addendum)
Keep the diet clean and stay active.  Check your blood pressures 2-3 times per week, alternating the time of day you check it. If it is high, considering waiting 1-2 minutes and rechecking. If it gets higher, your anxiety is likely creeping up and we should avoid rechecking. Send me a message in 4 weeks with your readings.   Let us know if you need anything.

## 2022-01-13 ENCOUNTER — Encounter: Payer: Self-pay | Admitting: Family Medicine

## 2022-02-02 ENCOUNTER — Other Ambulatory Visit: Payer: Self-pay | Admitting: Family Medicine

## 2022-02-02 DIAGNOSIS — I10 Essential (primary) hypertension: Secondary | ICD-10-CM

## 2022-04-04 ENCOUNTER — Telehealth: Payer: Self-pay | Admitting: Family Medicine

## 2022-04-04 ENCOUNTER — Ambulatory Visit: Payer: Medicare Other | Admitting: Family Medicine

## 2022-04-04 DIAGNOSIS — J310 Chronic rhinitis: Secondary | ICD-10-CM

## 2022-04-04 DIAGNOSIS — Z1231 Encounter for screening mammogram for malignant neoplasm of breast: Secondary | ICD-10-CM | POA: Diagnosis not present

## 2022-04-04 LAB — HM MAMMOGRAPHY

## 2022-04-04 MED ORDER — LEVOCETIRIZINE DIHYDROCHLORIDE 5 MG PO TABS: 5.0000 mg | ORAL_TABLET | Freq: Every evening | ORAL | 1 refills | Status: DC

## 2022-04-04 NOTE — Telephone Encounter (Signed)
done

## 2022-04-04 NOTE — Telephone Encounter (Signed)
Pt states is needing refill on levocetirizine (XYZAL) 5 MG tablet for 90 days send to [facility]  [place], [place] [address]  Phone:  [phone]  Fax:  [phone] . Please advise.

## 2022-04-05 ENCOUNTER — Encounter: Payer: Self-pay | Admitting: Family Medicine

## 2022-04-20 ENCOUNTER — Encounter: Payer: Self-pay | Admitting: Family Medicine

## 2022-04-20 ENCOUNTER — Ambulatory Visit (INDEPENDENT_AMBULATORY_CARE_PROVIDER_SITE_OTHER): Payer: Medicare Other | Admitting: Family Medicine

## 2022-04-20 VITALS — BP 136/74 | HR 98 | Temp 98.3°F | Ht 61.0 in | Wt 131.5 lb

## 2022-04-20 DIAGNOSIS — J0100 Acute maxillary sinusitis, unspecified: Secondary | ICD-10-CM

## 2022-04-20 MED ORDER — DOXYCYCLINE HYCLATE 100 MG PO TABS: 100.0000 mg | ORAL_TABLET | Freq: Two times a day (BID) | ORAL | 0 refills | Status: DC

## 2022-04-20 MED ORDER — PREDNISONE 20 MG PO TABS: 40.0000 mg | ORAL_TABLET | Freq: Every day | ORAL | 0 refills | Status: AC

## 2022-04-20 NOTE — Patient Instructions (Addendum)
Continue to push fluids, practice good hand hygiene, and cover your mouth if you cough.  If you start having fevers, shaking or shortness of breath, seek immediate care.  OK to take Tylenol 1000 mg (2 extra strength tabs) or 975 mg (3 regular strength tabs) every 6 hours as needed.  Start prednisone right away. If you worsen by tomorrow, take the antibiotic. If no better in the next few days, also take the antibiotic.   Let us know if you need anything.

## 2022-04-20 NOTE — Progress Notes (Signed)
Chief Complaint  Patient presents with   Sinusitis    Kelsey Stanley here for URI complaints.  Duration: 6 days; worsening for past 2 d Associated symptoms: subjective fever, sinus headache, sinus congestion, R sided sinus pain, rhinorrhea, sore throat, and coughing, upper dental pain on R Denies: ear pain, ear drainage, wheezing, shortness of breath, myalgia, and fevers Treatment to date: Tylenol Sick contacts: No  Past Medical History:  Diagnosis Date   GERD (gastroesophageal reflux disease)    Hyperlipidemia    Hypertension     Objective BP 136/74   Pulse 98   Temp 98.3 F (36.8 C) (Oral)   Ht '5\' 1"'$  (1.549 m)   Wt 131 lb 8 oz (59.6 kg)   SpO2 99%   BMI 24.85 kg/m  General: Awake, alert, appears stated age HEENT: AT, [name], ears patent b/l and TM's neg, nares patent w/o discharge, pharynx pink and without exudates, MMM, ttp over R max sinus Neck: No masses or asymmetry Heart: RRR Lungs: CTAB, no accessory muscle use Psych: Age appropriate judgment and insight, normal mood and affect  Acute maxillary sinusitis, recurrence not specified - Plan: predniSONE (DELTASONE) 20 MG tablet, doxycycline (VIBRA-TABS) 100 MG tablet  5 d pred burst, 40 mg/d. If no better in a few days, will take 7 d of doxy. If continuing to worsen by tomorrow, will meet criteria to take doxy also.  Continue to push fluids, practice good hand hygiene, cover mouth when coughing. F/u prn. If starting to experience fevers, shaking, or shortness of breath, seek immediate care. Pt voiced understanding and agreement to the plan.  [place], DO 04/20/22 4:19 PM

## 2022-04-24 ENCOUNTER — Encounter: Payer: Self-pay | Admitting: Family Medicine

## 2022-04-27 ENCOUNTER — Other Ambulatory Visit: Payer: Self-pay | Admitting: Family Medicine

## 2022-04-29 ENCOUNTER — Other Ambulatory Visit: Payer: Self-pay | Admitting: Family Medicine

## 2022-04-29 MED ORDER — CEFDINIR 300 MG PO CAPS: 300.0000 mg | ORAL_CAPSULE | Freq: Two times a day (BID) | ORAL | 0 refills | Status: DC

## 2022-05-04 ENCOUNTER — Other Ambulatory Visit: Payer: Self-pay | Admitting: Family Medicine

## 2022-05-06 DIAGNOSIS — H40012 Open angle with borderline findings, low risk, left eye: Secondary | ICD-10-CM | POA: Diagnosis not present

## 2022-05-06 DIAGNOSIS — H40011 Open angle with borderline findings, low risk, right eye: Secondary | ICD-10-CM | POA: Diagnosis not present

## 2022-05-25 DIAGNOSIS — H9319 Tinnitus, unspecified ear: Secondary | ICD-10-CM | POA: Diagnosis not present

## 2022-05-25 DIAGNOSIS — H938X1 Other specified disorders of right ear: Secondary | ICD-10-CM | POA: Diagnosis not present

## 2022-05-25 DIAGNOSIS — H6121 Impacted cerumen, right ear: Secondary | ICD-10-CM | POA: Diagnosis not present

## 2022-06-10 ENCOUNTER — Ambulatory Visit (INDEPENDENT_AMBULATORY_CARE_PROVIDER_SITE_OTHER): Payer: Medicare Other | Admitting: Family Medicine

## 2022-06-10 ENCOUNTER — Encounter: Payer: Self-pay | Admitting: Family Medicine

## 2022-06-10 VITALS — BP 138/80 | HR 47 | Temp 97.8°F | Ht 62.0 in | Wt 132.1 lb

## 2022-06-10 DIAGNOSIS — E785 Hyperlipidemia, unspecified: Secondary | ICD-10-CM | POA: Diagnosis not present

## 2022-06-10 DIAGNOSIS — I10 Essential (primary) hypertension: Secondary | ICD-10-CM | POA: Diagnosis not present

## 2022-06-10 DIAGNOSIS — R7303 Prediabetes: Secondary | ICD-10-CM

## 2022-06-10 LAB — LIPID PANEL
Cholesterol: 214 mg/dL — ABNORMAL HIGH (ref 0–200)
HDL: 53.4 mg/dL (ref >39.00)
LDL Cholesterol: 129 mg/dL — ABNORMAL HIGH (ref 0–99)
NonHDL: 160.45
Total CHOL/HDL Ratio: 4
Triglycerides: 157 mg/dL — ABNORMAL HIGH (ref 0.0–149.0)
VLDL: 31.4 mg/dL (ref 0.0–40.0)

## 2022-06-10 LAB — COMPREHENSIVE METABOLIC PANEL
ALT: 14 U/L (ref 0–35)
AST: 18 U/L (ref 0–37)
Albumin: 4.3 g/dL (ref 3.5–5.2)
Alkaline Phosphatase: 70 U/L (ref 39–117)
BUN: 19 mg/dL (ref 6–23)
CO2: 30 mEq/L (ref 19–32)
Calcium: 9.6 mg/dL (ref 8.4–10.5)
Chloride: 106 mEq/L (ref 96–112)
Creatinine, Ser: 1.12 mg/dL (ref 0.40–1.20)
GFR: 50.09 mL/min — ABNORMAL LOW (ref >60.00)
Glucose, Bld: 107 mg/dL — ABNORMAL HIGH (ref 70–99)
Potassium: 4.4 mEq/L (ref 3.5–5.1)
Sodium: 142 mEq/L (ref 135–145)
Total Bilirubin: 0.5 mg/dL (ref 0.2–1.2)
Total Protein: 6.9 g/dL (ref 6.0–8.3)

## 2022-06-10 LAB — HEMOGLOBIN A1C: Hgb A1c MFr Bld: 6.4 % (ref 4.6–6.5)

## 2022-06-10 NOTE — Patient Instructions (Signed)
Give us 2-3 business days to get the results of your labs back.   Keep the diet clean and stay active.  I recommend getting the flu shot in mid October. This suggestion would change if the CDC comes out with a different recommendation.   Let us know if you need anything. 

## 2022-06-10 NOTE — Progress Notes (Signed)
Chief Complaint  Patient presents with   Follow-up    Subjective Kelsey Stanley is a 71 y.o. female who presents for hypertension follow up. She does monitor home blood pressures. Blood pressures ranging from 120's/60-70's on average. She is compliant with medications- lisinopril 40 mg/d, Toprol XL 100 mg/d. Patient has these side effects of medication: none She is usually adhering to a healthy diet overall. Current exercise: some walking No Cp or SOB.   Hyperlipidemia Patient presents for dyslipidemia follow up. Currently being treated with pravastatin 40 mg/d and compliance with treatment thus far has been good. She denies myalgias. Diet/exercise as above.  The patient is not known to have coexisting coronary artery disease.   Past Medical History:  Diagnosis Date   GERD (gastroesophageal reflux disease)    Hyperlipidemia    Hypertension     Exam BP 138/80   Pulse (!) 47   Temp 97.8 F (36.6 C) (Oral)   Ht '5\' 2"'$  (1.575 m)   Wt 132 lb 2 oz (59.9 kg)   SpO2 93%   BMI 24.17 kg/m  General:  well developed, well nourished, in no apparent distress Heart: RRR, no bruits, no LE edema Lungs: clear to auscultation, no accessory muscle use Psych: well oriented with normal range of affect and appropriate judgment/insight  Essential hypertension  Hyperlipidemia, unspecified hyperlipidemia type - Plan: Comprehensive metabolic panel, Lipid panel  Prediabetes - Plan: Hemoglobin A1c  Chronic, stable. Cont lisinopril 40 mg/d, Toprol XL 100 mg/d. Counseled on diet and exercise. Chronic, stable.  Continue pravastatin 40 mg daily.  Check labs as above. Monitor A1c. F/u in 6 mo. The patient voiced understanding and agreement to the plan.  [place], DO 06/10/22  9:41 AM

## 2022-07-09 DIAGNOSIS — I110 Hypertensive heart disease with heart failure: Secondary | ICD-10-CM | POA: Diagnosis not present

## 2022-07-09 DIAGNOSIS — J810 Acute pulmonary edema: Secondary | ICD-10-CM | POA: Diagnosis not present

## 2022-07-09 DIAGNOSIS — I255 Ischemic cardiomyopathy: Secondary | ICD-10-CM | POA: Diagnosis not present

## 2022-07-09 DIAGNOSIS — Z8249 Family history of ischemic heart disease and other diseases of the circulatory system: Secondary | ICD-10-CM | POA: Diagnosis not present

## 2022-07-09 DIAGNOSIS — R079 Chest pain, unspecified: Secondary | ICD-10-CM | POA: Diagnosis not present

## 2022-07-09 DIAGNOSIS — R918 Other nonspecific abnormal finding of lung field: Secondary | ICD-10-CM | POA: Diagnosis not present

## 2022-07-09 DIAGNOSIS — I081 Rheumatic disorders of both mitral and tricuspid valves: Secondary | ICD-10-CM | POA: Diagnosis not present

## 2022-07-09 DIAGNOSIS — I10 Essential (primary) hypertension: Secondary | ICD-10-CM | POA: Diagnosis not present

## 2022-07-09 DIAGNOSIS — Z79899 Other long term (current) drug therapy: Secondary | ICD-10-CM | POA: Diagnosis not present

## 2022-07-09 DIAGNOSIS — R9430 Abnormal result of cardiovascular function study, unspecified: Secondary | ICD-10-CM | POA: Diagnosis not present

## 2022-07-09 DIAGNOSIS — Z791 Long term (current) use of non-steroidal anti-inflammatories (NSAID): Secondary | ICD-10-CM | POA: Diagnosis not present

## 2022-07-09 DIAGNOSIS — I5021 Acute systolic (congestive) heart failure: Secondary | ICD-10-CM | POA: Diagnosis not present

## 2022-07-09 DIAGNOSIS — Z556 Problems related to health literacy: Secondary | ICD-10-CM | POA: Diagnosis not present

## 2022-07-09 DIAGNOSIS — I2510 Atherosclerotic heart disease of native coronary artery without angina pectoris: Secondary | ICD-10-CM | POA: Diagnosis not present

## 2022-07-09 DIAGNOSIS — N179 Acute kidney failure, unspecified: Secondary | ICD-10-CM | POA: Diagnosis not present

## 2022-07-09 DIAGNOSIS — R7303 Prediabetes: Secondary | ICD-10-CM | POA: Diagnosis not present

## 2022-07-09 DIAGNOSIS — I214 Non-ST elevation (NSTEMI) myocardial infarction: Secondary | ICD-10-CM | POA: Diagnosis not present

## 2022-07-09 DIAGNOSIS — E782 Mixed hyperlipidemia: Secondary | ICD-10-CM | POA: Diagnosis not present

## 2022-07-09 DIAGNOSIS — K219 Gastro-esophageal reflux disease without esophagitis: Secondary | ICD-10-CM | POA: Diagnosis not present

## 2022-07-09 DIAGNOSIS — R0789 Other chest pain: Secondary | ICD-10-CM | POA: Diagnosis not present

## 2022-07-09 DIAGNOSIS — E785 Hyperlipidemia, unspecified: Secondary | ICD-10-CM | POA: Diagnosis not present

## 2022-07-13 DIAGNOSIS — I214 Non-ST elevation (NSTEMI) myocardial infarction: Secondary | ICD-10-CM | POA: Diagnosis not present

## 2022-07-13 DIAGNOSIS — E785 Hyperlipidemia, unspecified: Secondary | ICD-10-CM | POA: Diagnosis not present

## 2022-07-13 DIAGNOSIS — I255 Ischemic cardiomyopathy: Secondary | ICD-10-CM | POA: Diagnosis not present

## 2022-07-13 DIAGNOSIS — I10 Essential (primary) hypertension: Secondary | ICD-10-CM | POA: Diagnosis not present

## 2022-07-13 DIAGNOSIS — R61 Generalized hyperhidrosis: Secondary | ICD-10-CM | POA: Diagnosis not present

## 2022-07-13 DIAGNOSIS — Z955 Presence of coronary angioplasty implant and graft: Secondary | ICD-10-CM | POA: Diagnosis not present

## 2022-07-14 ENCOUNTER — Encounter: Payer: Self-pay | Admitting: *Deleted

## 2022-07-14 ENCOUNTER — Telehealth: Payer: Self-pay | Admitting: *Deleted

## 2022-07-14 NOTE — Patient Outreach (Signed)
  Care Coordination [facility] Note Transition Care Management Follow-up Telephone Call Date of discharge and from where: 07/12/22 [facility] [place]: NSTEMI How have you been since you were released from the hospital? "I have been doing great!  It is amazing how good modern medicine is-- I can't even tell I had a heart attack, the stent really helped-- I feel normal and am back to my normal self.  I had a very bad night the first night I came home, but I called the cardiology clinic and they worked me in yesterday and did blood work and another EKG and everything was just fine.  I go to see Dr. Hart Carwin tomorrow and yes- I will ask him about getting the flu and the RSV vaccines" Any questions or concerns? No- patient reports newly prescribed Brilinta ACT "is very expensive;" provided education around possibility of PAP and offered pharmacy referral however, patient declines: she reports that the cardiology team provided her with samples during yesterdays office visit and she said she wants to talk to her new cardiologist before she makes any decisions; states she is fine taking it, and understands she is currently in donut hole.  Items Reviewed: Did the pt receive and understand the discharge instructions provided? No  Medications obtained and verified? Yes  Other? No  Any new allergies since your discharge? No  Dietary orders reviewed? Yes Do you have support at home? Yes  husband assisting as indicated/ needed  Home Care and Equipment/Supplies: Were home health services ordered? no If so, what is the name of the agency? N/A  Has the agency set up a time to come to the patient's home? not applicable Were any new equipment or medical supplies ordered?  No What is the name of the medical supply agency? N/A Were you able to get the supplies/equipment? not applicable Do you have any questions related to the use of the equipment or supplies? No  Functional Questionnaire: (I = Independent and D =  Dependent) ADLs: I  Bathing/Dressing- I  Meal Prep- I  Eating- I  Maintaining continence- I  Transferring/Ambulation- I  Managing Meds- I  Follow up appointments reviewed:  PCP Hospital f/u appt confirmed? Yes  Scheduled to see PCP, Dr. Nani Ravens on tomorrow, Friday 07/15/22 @ 11:15 am Specialist Hospital f/u appt confirmed? Yes  Confirmed patient attended cardiology provider office visit at [facility] yesterday, 07/13/22  Are transportation arrangements needed? No  If their condition worsens, is the pt aware to call PCP or go to the Emergency Dept.? Yes Was the patient provided with contact information for the PCP's office or ED? Yes Was to pt encouraged to call back with questions or concerns? Yes  SDOH assessments and interventions completed:   Yes  Care Coordination Interventions Activated:  Yes   Care Coordination Interventions:  Provided education around process to obtain PAP for ACT (Brilinta), should she change her mind and wish to apply in the future; provided education around need to obtain 2023-24 winter season flu vaccine; discussed need to to talk to PCP during tomorrow's office visit about whether PCP recommends new RSV vaccine     Oneta Rack, RN, BSN, CCRN Alumnus RN CM Care Coordination/ Transition of [place] Management [phone]: direct office    Encounter Outcome:  Pt. Visit Completed

## 2022-07-15 ENCOUNTER — Ambulatory Visit (INDEPENDENT_AMBULATORY_CARE_PROVIDER_SITE_OTHER): Payer: Medicare Other | Admitting: Family Medicine

## 2022-07-15 ENCOUNTER — Encounter: Payer: Self-pay | Admitting: Family Medicine

## 2022-07-15 VITALS — BP 130/72 | HR 76 | Temp 97.7°F | Ht 62.0 in | Wt 128.5 lb

## 2022-07-15 DIAGNOSIS — I214 Non-ST elevation (NSTEMI) myocardial infarction: Secondary | ICD-10-CM

## 2022-07-15 NOTE — Patient Instructions (Signed)
I will be in touch with my colleague regarding using Plavix instead of Brilinta.   Keep the diet clean and stay active.  Stay on your medicine.  Let us know if you need anything.

## 2022-07-15 NOTE — Progress Notes (Signed)
Chief Complaint  Patient presents with   Follow-up    Heart Attack    Subjective: Patient is a 71 y.o. female here for hosp f/u.  NSTEMI on 07/09/22, stent placed on 07/11/22. Started on Crestor 40 mg/d, ASA 81 mg/d, Brilinta 90 mg bid. No issues other than Brilinta costs her $500/month. She received a free mo coupon from her cardiologist office. No CP, arm pain, jaw pain, SOB, N/V. Eating healthier in last mo. Doing some walking.   Past Medical History:  Diagnosis Date   GERD (gastroesophageal reflux disease)    Hyperlipidemia    Hypertension     Objective: BP 130/72 (BP Location: Left Arm, Patient Position: Sitting, Cuff Size: Normal)   Pulse 76   Temp 97.7 F (36.5 C) (Oral)   Ht '5\' 2"'$  (1.575 m)   Wt 128 lb 8 oz (58.3 kg)   SpO2 99%   BMI 23.50 kg/m  General: Awake, appears stated age Heart: RRR, no LE edema Lungs: CTAB, no rales, wheezes or rhonchi. No accessory muscle use Psych: Age appropriate judgment and insight, normal affect and mood  Assessment and Plan: NSTEMI (non-ST elevated myocardial infarction) (HCC)  Cont ASA 81 mg/d, Brilinta 90 mg bid, Crestor 40 mg/d. Fairly asymptomatic. Appreciate cards. Will find out how essential Brilinta is as Plavix would likely be much cheaper. Counseled on diet/exercise.  The patient voiced understanding and agreement to the plan.  I spent 30 min with the patient discussing the above plan, discussing her case with a cardiologist, and reviewing her hospitalization on the same day of the visit.   [place], DO 07/15/22  12:21 PM

## 2022-08-01 DIAGNOSIS — H838X3 Other specified diseases of inner ear, bilateral: Secondary | ICD-10-CM | POA: Diagnosis not present

## 2022-08-01 DIAGNOSIS — H903 Sensorineural hearing loss, bilateral: Secondary | ICD-10-CM | POA: Diagnosis not present

## 2022-08-01 DIAGNOSIS — Z7901 Long term (current) use of anticoagulants: Secondary | ICD-10-CM | POA: Diagnosis not present

## 2022-08-01 DIAGNOSIS — H9313 Tinnitus, bilateral: Secondary | ICD-10-CM | POA: Diagnosis not present

## 2022-08-01 DIAGNOSIS — Z9889 Other specified postprocedural states: Secondary | ICD-10-CM | POA: Diagnosis not present

## 2022-08-17 DIAGNOSIS — I214 Non-ST elevation (NSTEMI) myocardial infarction: Secondary | ICD-10-CM | POA: Diagnosis not present

## 2022-08-18 DIAGNOSIS — I214 Non-ST elevation (NSTEMI) myocardial infarction: Secondary | ICD-10-CM | POA: Diagnosis not present

## 2022-08-19 ENCOUNTER — Other Ambulatory Visit: Payer: Self-pay | Admitting: Family Medicine

## 2022-08-19 MED ORDER — ROSUVASTATIN CALCIUM 40 MG PO TABS: 40.0000 mg | ORAL_TABLET | Freq: Every day | ORAL | 3 refills | Status: DC

## 2022-08-22 DIAGNOSIS — I214 Non-ST elevation (NSTEMI) myocardial infarction: Secondary | ICD-10-CM | POA: Diagnosis not present

## 2022-08-24 ENCOUNTER — Other Ambulatory Visit: Payer: Self-pay | Admitting: Family Medicine

## 2022-08-24 DIAGNOSIS — I214 Non-ST elevation (NSTEMI) myocardial infarction: Secondary | ICD-10-CM | POA: Diagnosis not present

## 2022-08-24 DIAGNOSIS — J310 Chronic rhinitis: Secondary | ICD-10-CM

## 2022-08-25 DIAGNOSIS — I214 Non-ST elevation (NSTEMI) myocardial infarction: Secondary | ICD-10-CM | POA: Diagnosis not present

## 2022-08-29 DIAGNOSIS — I214 Non-ST elevation (NSTEMI) myocardial infarction: Secondary | ICD-10-CM | POA: Diagnosis not present

## 2022-08-31 DIAGNOSIS — Z23 Encounter for immunization: Secondary | ICD-10-CM | POA: Diagnosis not present

## 2022-08-31 DIAGNOSIS — I214 Non-ST elevation (NSTEMI) myocardial infarction: Secondary | ICD-10-CM | POA: Diagnosis not present

## 2022-09-01 DIAGNOSIS — I214 Non-ST elevation (NSTEMI) myocardial infarction: Secondary | ICD-10-CM | POA: Diagnosis not present

## 2022-09-05 DIAGNOSIS — I214 Non-ST elevation (NSTEMI) myocardial infarction: Secondary | ICD-10-CM | POA: Diagnosis not present

## 2022-09-07 DIAGNOSIS — I214 Non-ST elevation (NSTEMI) myocardial infarction: Secondary | ICD-10-CM | POA: Diagnosis not present

## 2022-09-12 DIAGNOSIS — I214 Non-ST elevation (NSTEMI) myocardial infarction: Secondary | ICD-10-CM | POA: Diagnosis not present

## 2022-09-13 DIAGNOSIS — I5022 Chronic systolic (congestive) heart failure: Secondary | ICD-10-CM | POA: Diagnosis not present

## 2022-09-13 DIAGNOSIS — I214 Non-ST elevation (NSTEMI) myocardial infarction: Secondary | ICD-10-CM | POA: Diagnosis not present

## 2022-09-13 DIAGNOSIS — I10 Essential (primary) hypertension: Secondary | ICD-10-CM | POA: Diagnosis not present

## 2022-09-13 DIAGNOSIS — I255 Ischemic cardiomyopathy: Secondary | ICD-10-CM | POA: Diagnosis not present

## 2022-09-13 DIAGNOSIS — E785 Hyperlipidemia, unspecified: Secondary | ICD-10-CM | POA: Diagnosis not present

## 2022-09-14 DIAGNOSIS — I214 Non-ST elevation (NSTEMI) myocardial infarction: Secondary | ICD-10-CM | POA: Diagnosis not present

## 2022-09-15 DIAGNOSIS — I214 Non-ST elevation (NSTEMI) myocardial infarction: Secondary | ICD-10-CM | POA: Diagnosis not present

## 2022-09-16 NOTE — Progress Notes (Unsigned)
Referring-Nicholas Wendling DO Reason for referral-CAD  HPI: 71 yo female for evaluation of CAD at request of Riki Sheer DO. Previously cared for at [facility]. Had NSTEMI 9/23. Cath showed 99 LAD, 50 RI and 80 nondominant Lcx. Pt had PCI of LAD with DES. Echo showed EF 35-40, mild MR and TR. Pt now transitioning to me.   Current Outpatient Medications  Medication Sig Dispense Refill   aspirin 81 MG chewable tablet Chew 81 mg by mouth daily.     cholecalciferol (VITAMIN D) 400 units TABS tablet Take 400 Units by mouth daily.      levocetirizine (XYZAL) 5 MG tablet TAKE 1 TABLET EVERY EVENING 90 tablet 10   lisinopril (ZESTRIL) 40 MG tablet TAKE 1 TABLET EVERY DAY 90 tablet 3   metoprolol succinate (TOPROL-XL) 100 MG 24 hr tablet TAKE 1 TABLET DAILY. TAKE WITH OR IMMEDIATELY FOLLOWING A MEAL. 90 tablet 2   nitroGLYCERIN (NITROSTAT) 0.4 MG SL tablet Place under the tongue.     pantoprazole (PROTONIX) 20 MG tablet TAKE 1 TABLET EVERY DAY 90 tablet 2   Probiotic Product (PROBIOTIC-10 PO) Take 1 capsule by mouth daily.      rosuvastatin (CRESTOR) 40 MG tablet Take 1 tablet (40 mg total) by mouth daily. 90 tablet 3   valACYclovir (VALTREX) 1000 MG tablet Take 2 tabs and repeat in 12 hours in event of an outbreak. 90 tablet 2   No current facility-administered medications for this visit.    Allergies  Allergen Reactions   Dilaudid [Hydromorphone] Nausea And Vomiting   Fentanyl Nausea And Vomiting   Morphine And Related Other (See Comments)    Unable to communicate   Amoxicillin Rash   Erythromycin Rash     Past Medical History:  Diagnosis Date   GERD (gastroesophageal reflux disease)    Hyperlipidemia    Hypertension     Past Surgical History:  Procedure Laterality Date   ABDOMINAL HYSTERECTOMY  1993   BREAST BIOPSY Right 1994   CHOLECYSTECTOMY, LAPAROSCOPIC  1993    Social History   Socioeconomic History   Marital status: Married    Spouse name: Not on file    Number of children: Not on file   Years of education: Not on file   Highest education level: Not on file  Occupational History   Not on file  Tobacco Use   Smoking status: Never   Smokeless tobacco: Never  Substance and Sexual Activity   Alcohol use: Never   Drug use: Never   Sexual activity: Yes  Other Topics Concern   Not on file  Social History Narrative   Not on file   Social Determinants of Health   Financial Resource Strain: Low Risk  (12/10/2021)   Overall Financial Resource Strain (CARDIA)    Difficulty of Paying Living Expenses: Not hard at all  Food Insecurity: No Food Insecurity (07/14/2022)   Hunger Vital Sign    Worried About Running Out of Food in the Last Year: Never true    [place] in the Last Year: Never true  Transportation Needs: No Transportation Needs (07/14/2022)   PRAPARE - Hydrologist (Medical): No    Lack of Transportation (Non-Medical): No  Physical Activity: Sufficiently Active (12/10/2021)   Exercise Vital Sign    Days of Exercise per Week: 7 days    Minutes of Exercise per Session: 30 min  Stress: No Stress Concern Present (12/10/2021)   [employer] of  Occupational Health - Occupational Stress Questionnaire    Feeling of Stress : Not at all  Social Connections: Moderately Integrated (12/10/2021)   Social Connection and Isolation Panel [NHANES]    Frequency of Communication with Friends and Family: More than three times a week    Frequency of Social Gatherings with Friends and Family: Once a week    Attends Religious Services: More than 4 times per year    Active Member of [employer] or Organizations: No    Attends Archivist Meetings: Never    Marital Status: Married  Human resources officer Violence: Not At Risk (12/10/2021)   Humiliation, Afraid, Rape, and Kick questionnaire    Fear of Current or Ex-Partner: No    Emotionally Abused: No    Physically Abused: No    Sexually Abused: No    Family  History  Problem Relation Age of Onset   Diabetes Mother    Hyperlipidemia Mother    Hypertension Mother    Glaucoma Mother    Cancer Mother        bladder cancer   Heart disease Father    Diabetes Sister    Hypertension Sister    Hyperlipidemia Brother    Hypertension Brother    Diabetes Sister    Hypertension Sister     ROS: no fevers or chills, productive cough, hemoptysis, dysphasia, odynophagia, melena, hematochezia, dysuria, hematuria, rash, seizure activity, orthopnea, PND, pedal edema, claudication. Remaining systems are negative.  Physical Exam:   There were no vitals taken for this visit.  General:  Well developed/well nourished in NAD Skin warm/dry Patient not depressed No peripheral clubbing Back-normal HEENT-normal/normal eyelids Neck supple/normal carotid upstroke bilaterally; no bruits; no JVD; no thyromegaly chest - CTA/ normal expansion CV - RRR/normal S1 and S2; no murmurs, rubs or gallops;  PMI nondisplaced Abdomen -NT/ND, no HSM, no mass, + bowel sounds, no bruit 2+ femoral pulses, no bruits Ext-no edema, chords, 2+ DP Neuro-grossly nonfocal  ECG - personally reviewed  A/P  1 CAD-no CP; continue ASA, brilinta and statin.  2 ischemic CM- plan repeat echo; if LV function remains decreased with transition from ACEI to entresto; continue toprol.  3 Hypertension-  4 hyperlipidemia-continue crestor. Check lipids and liver.   Kirk Ruths, MD

## 2022-09-19 ENCOUNTER — Ambulatory Visit: Payer: Medicare Other | Attending: Cardiology | Admitting: Cardiology

## 2022-09-19 ENCOUNTER — Encounter: Payer: Self-pay | Admitting: Cardiology

## 2022-09-19 VITALS — BP 132/64 | HR 64 | Ht 62.0 in | Wt 123.0 lb

## 2022-09-19 DIAGNOSIS — I10 Essential (primary) hypertension: Secondary | ICD-10-CM

## 2022-09-19 DIAGNOSIS — E7800 Pure hypercholesterolemia, unspecified: Secondary | ICD-10-CM

## 2022-09-19 DIAGNOSIS — I2510 Atherosclerotic heart disease of native coronary artery without angina pectoris: Secondary | ICD-10-CM | POA: Diagnosis not present

## 2022-09-19 DIAGNOSIS — I255 Ischemic cardiomyopathy: Secondary | ICD-10-CM | POA: Diagnosis not present

## 2022-09-19 MED ORDER — LISINOPRIL 20 MG PO TABS: 20.0000 mg | ORAL_TABLET | Freq: Every day | ORAL | 3 refills | Status: DC

## 2022-09-19 NOTE — Patient Instructions (Signed)
Medication Instructions:   REDUCE LISINOPRIL TO 20 MG ONCE DAILY= 1/2 OF THE 40 MG TABLET ONCE DAILY  *If you need a refill on your cardiac medications before your next appointment, please call your pharmacy*   Lab Work:  Your physician recommends that you return for lab work FASTING  If you have labs (blood work) drawn today and your tests are completely normal, you will receive your results only by: [place] (if you have MyChart) OR A paper copy in the mail If you have any lab test that is abnormal or we need to change your treatment, we will call you to review the results.   Testing/Procedures:  Your physician has requested that you have an echocardiogram. Echocardiography is a painless test that uses sound waves to create images of your heart. It provides your doctor with information about the size and shape of your heart and how well your heart's chambers and valves are working. This procedure takes approximately one hour. There are no restrictions for this procedure. Please do NOT wear cologne, perfume, aftershave, or lotions (deodorant is allowed). Please arrive 15 minutes prior to your appointment time. [place], you and your health needs are our priority.  As part of our continuing mission to provide you with exceptional heart care, we have created designated Provider Care Teams.  These Care Teams include your primary Cardiologist (physician) and Advanced Practice Providers (APPs -  Physician Assistants and Nurse Practitioners) who all work together to provide you with the care you need, when you need it.  We recommend signing up for the patient portal called "MyChart".  Sign up information is provided on this After Visit Summary.  MyChart is used to connect with patients for Virtual Visits (Telemedicine).  Patients are able to view lab/test results, encounter notes, upcoming appointments, etc.  Non-urgent messages can  be sent to your provider as well.   To learn more about what you can do with MyChart, go to [email or web address].    Your next appointment:   6 month(s)  The format for your next appointment:   In Person  Provider:   Kirk Ruths MD

## 2022-09-20 ENCOUNTER — Encounter: Payer: Self-pay | Admitting: Family Medicine

## 2022-09-20 ENCOUNTER — Ambulatory Visit (INDEPENDENT_AMBULATORY_CARE_PROVIDER_SITE_OTHER): Payer: Medicare Other | Admitting: Family Medicine

## 2022-09-20 VITALS — BP 120/78 | HR 79 | Temp 97.7°F | Ht 62.0 in | Wt 121.4 lb

## 2022-09-20 DIAGNOSIS — M7521 Bicipital tendinitis, right shoulder: Secondary | ICD-10-CM

## 2022-09-20 DIAGNOSIS — I7300 Raynaud's syndrome without gangrene: Secondary | ICD-10-CM

## 2022-09-20 DIAGNOSIS — E7800 Pure hypercholesterolemia, unspecified: Secondary | ICD-10-CM | POA: Diagnosis not present

## 2022-09-20 MED ORDER — NIFEDIPINE ER OSMOTIC RELEASE 30 MG PO TB24: ORAL_TABLET | ORAL | 2 refills | Status: DC

## 2022-09-20 MED ORDER — PREDNISONE 20 MG PO TABS: 40.0000 mg | ORAL_TABLET | Freq: Every day | ORAL | 0 refills | Status: AC

## 2022-09-20 NOTE — Progress Notes (Signed)
Musculoskeletal Exam  Patient: Kelsey Stanley DOB: [birth year]  DOS: 09/20/2022  SUBJECTIVE:  Chief Complaint:   Chief Complaint  Patient presents with   Shoulder Pain    Right     Kelsey Stanley is a 71 y.o.  female for evaluation and treatment of R shoulder pain.   Onset:  3 months ago. No inj or change in activity.  Location: anterior shoulder Character:  aching  Progression of issue:  is unchanged Comes and goes.  Associated symptoms: radiates down arm No decreased ROM, bruising, redness, swelling Treatment: to date has been heat, stretching.   Neurovascular symptoms: no  Raynaud's Patient's fingertips will turn blue/white and become painful when she is exposed to cold environments.  This has been going on for around a year and seems to be getting worse.  She does not have any sores on her fingers.  She has never been on any treatment for this.  Past Medical History:  Diagnosis Date   CAD (coronary artery disease)    GERD (gastroesophageal reflux disease)    Hyperlipidemia    Hypertension     Objective: VITAL SIGNS: BP 120/78 (BP Location: Left Arm, Patient Position: Sitting, Cuff Size: Normal)   Pulse 79   Temp 97.7 F (36.5 C) (Oral)   Ht '5\' 2"'$  (1.575 m)   Wt 121 lb 6 oz (55.1 kg)   SpO2 98%   BMI 22.20 kg/m  Constitutional: Well formed, well developed. No acute distress. Thorax & Lungs: No accessory muscle use Skin: No skin changes noted at this time (room is quite warm) Musculoskeletal: R shoulder.   Normal active range of motion: yes.   Normal passive range of motion: yes Tenderness to palpation: yes over short head of biceps Deformity: no Ecchymosis: no Tests positive: Speed's Tests negative: Yergason's, Neer's, crossover, empty can, liftoff, [place] Neurologic: Normal sensory function. No focal deficits noted. DTR's equal and symmetric in UE's. No clonus. Psychiatric: Normal mood. Age appropriate judgment and insight. Alert & oriented x 3.     Assessment:  Biceps tendinitis of right upper extremity - Plan: predniSONE (DELTASONE) 20 MG tablet  Raynaud's disease without gangrene - Plan: NIFEdipine (PROCARDIA-XL/NIFEDICAL-XL) 30 MG 24 hr tablet  Plan: Stretches/exercises, heat, ice, Tylenol.  5-day prednisone burst 40 mg daily.  PT if no better. Chronic, uncontrolled.  Start nifedipine XL 30 mg daily as needed.  Try to avoid cool climates/situations. F/u as needed. The patient voiced understanding and agreement to the plan.   [place], DO 09/20/22  4:34 PM

## 2022-09-20 NOTE — Patient Instructions (Addendum)
Heat (pad or rice pillow in microwave) over affected area, 10-15 minutes twice daily.   Ice/cold pack over area for 10-15 min twice daily.  OK to take Tylenol 1000 mg (2 extra strength tabs) or 975 mg (3 regular strength tabs) every 6 hours as needed.  Let us know if you need anything.  Biceps Tendon Disruption (Proximal) Rehab Do exercises exactly as told by your health care provider and adjust them as directed. It is normal to feel mild stretching, pulling, tightness, or discomfort as you do these exercises, but you should stop right away if you feel sudden pain or your pain gets worse.  Stretching and range of motion exercises These exercises warm up your muscles and joints and improve the movement and flexibility of your arm and shoulder. These exercises also help to relieve pain and stiffness. Exercise A: Shoulder flexion, standing   Stand facing a wall. Put your left / right hand on the wall. Slide your left / right hand up the wall. Stop when you feel a stretch in your shoulder, or when you reach the angle recommended by your health care provider. Use your other hand to help raise your arm, if needed. As your hand gets higher, you may need to step closer to the wall. Avoid shrugging your shoulder while you raise your arm. To do this, keep your shoulder blade tucked down toward your spine. Hold for 30 seconds. Slowly return to the starting position. Use your other arm to help, if needed. Repeat 2 times. Complete this exercise 3 times per week. Exercise B: Pendulum   Stand near a wall or a surface that you can hold onto for balance. Bend at the waist and let your left / right arm hang straight down. Use your other arm to support you. Relax your arm and shoulder muscles, and move your hips and your trunk so your left / right arm swings freely. Your arm should swing because of the motion of your body, not because you are using your arm or shoulder muscles. Keep moving so your arm swings  in the following directions, as told by your health care provider: Side to side. Forward and backward. In clockwise and counterclockwise circles. Slowly return to the starting position. Repeat 2 times. Complete this exercise 3 times per week.  Strengthening exercises These exercises build strength and endurance in your arm and shoulder. Endurance is the ability to use your muscles for a long time, even after your muscles get tired. Exercise C: Elbow flexion, neutral  Sit on a stable chair without armrests, or stand. Hold a 3-5 lb weight in your left / right hand, or hold an exercise band with both hands. Your palms should face each other at the starting position. Bend your left / right elbow and move your hand up toward your shoulder. Lead with your thumb, and keep your palm facing the same direction. Keep your other arm straight down, in the starting position. Slowly return to the starting position. Repeat 2-3 times. Complete this exercise 3 times per week. Exercise D: Forearm supination   Sit with your left / right forearm on a table. Your elbow should be below shoulder height. Rest your hand over the edge of the table so your palm faces down. If directed, hold a hammer with your left / right hand. Without moving your elbow, slowly rotate your hand so your palm faces up toward the ceiling. If you are holding a hammer, begin by holding the hammer near the  head. When this exercise gets easier for you, hold the hammer farther down the handle. Hold for 3 seconds. Slowly return to the starting position. Repeat 2 times. Complete this exercise 3 times per week. Exercise E: Scapular retraction   Sit in a stable chair without armrests, or stand. Secure an exercise band to a stable object in front of you so the band is at shoulder height. Hold one end of the exercise band in each hand. Squeeze your shoulder blades together and move your elbows slightly behind you. Do not shrug your  shoulders. Hold for 3 seconds. Slowly return to the starting position. Repeat 2 times. Complete this exercise 3 times per week. Exercise F: Scapular protraction, supine   Lie on your back on a firm surface. Hold a 3-5 lb weight in your left / right hand. Raise your left / right arm straight into the air so your hand is directly above your shoulder joint. Push the weight into the air so your shoulder lifts off of the surface that you are lying on. Do not move your head, neck, or back. Hold for 3 seconds. Slowly return to the starting position. Let your muscles relax completely before you repeat this exercise. Repeat 2 times. Complete this exercise 3 times per week. This information is not intended to replace advice given to you by your health care provider. Make sure you discuss any questions you have with your health care provider. Document Released: 10/03/2005 Document Revised: 06/09/2016 Document Reviewed: 09/11/2015 Elsevier Interactive Patient Education  2017 [employer].

## 2022-09-21 DIAGNOSIS — Z5189 Encounter for other specified aftercare: Secondary | ICD-10-CM | POA: Diagnosis not present

## 2022-09-21 DIAGNOSIS — I252 Old myocardial infarction: Secondary | ICD-10-CM | POA: Diagnosis not present

## 2022-09-21 LAB — HEPATIC FUNCTION PANEL
ALT: 23 IU/L (ref 0–32)
AST: 25 IU/L (ref 0–40)
Albumin: 4.3 g/dL (ref 3.9–4.9)
Alkaline Phosphatase: 77 IU/L (ref 44–121)
Bilirubin Total: 0.2 mg/dL (ref 0.0–1.2)
Bilirubin, Direct: <0.1 mg/dL (ref 0.00–0.40)
Total Protein: 6.8 g/dL (ref 6.0–8.5)

## 2022-09-21 LAB — LIPID PANEL
Chol/HDL Ratio: 2.8 ratio (ref 0.0–4.4)
Cholesterol, Total: 136 mg/dL (ref 100–199)
HDL: 49 mg/dL (ref >39)
LDL Chol Calc (NIH): 64 mg/dL (ref 0–99)
Triglycerides: 134 mg/dL (ref 0–149)
VLDL Cholesterol Cal: 23 mg/dL (ref 5–40)

## 2022-09-22 DIAGNOSIS — Z5189 Encounter for other specified aftercare: Secondary | ICD-10-CM | POA: Diagnosis not present

## 2022-09-22 DIAGNOSIS — I252 Old myocardial infarction: Secondary | ICD-10-CM | POA: Diagnosis not present

## 2022-09-26 DIAGNOSIS — Z5189 Encounter for other specified aftercare: Secondary | ICD-10-CM | POA: Diagnosis not present

## 2022-09-26 DIAGNOSIS — I252 Old myocardial infarction: Secondary | ICD-10-CM | POA: Diagnosis not present

## 2022-09-28 DIAGNOSIS — I252 Old myocardial infarction: Secondary | ICD-10-CM | POA: Diagnosis not present

## 2022-09-28 DIAGNOSIS — Z5189 Encounter for other specified aftercare: Secondary | ICD-10-CM | POA: Diagnosis not present

## 2022-10-03 DIAGNOSIS — I252 Old myocardial infarction: Secondary | ICD-10-CM | POA: Diagnosis not present

## 2022-10-03 DIAGNOSIS — Z5189 Encounter for other specified aftercare: Secondary | ICD-10-CM | POA: Diagnosis not present

## 2022-10-05 DIAGNOSIS — I252 Old myocardial infarction: Secondary | ICD-10-CM | POA: Diagnosis not present

## 2022-10-05 DIAGNOSIS — Z5189 Encounter for other specified aftercare: Secondary | ICD-10-CM | POA: Diagnosis not present

## 2022-10-06 DIAGNOSIS — I252 Old myocardial infarction: Secondary | ICD-10-CM | POA: Diagnosis not present

## 2022-10-06 DIAGNOSIS — Z5189 Encounter for other specified aftercare: Secondary | ICD-10-CM | POA: Diagnosis not present

## 2022-10-12 DIAGNOSIS — I252 Old myocardial infarction: Secondary | ICD-10-CM | POA: Diagnosis not present

## 2022-10-12 DIAGNOSIS — Z5189 Encounter for other specified aftercare: Secondary | ICD-10-CM | POA: Diagnosis not present

## 2022-10-13 DIAGNOSIS — I252 Old myocardial infarction: Secondary | ICD-10-CM | POA: Diagnosis not present

## 2022-10-13 DIAGNOSIS — Z5189 Encounter for other specified aftercare: Secondary | ICD-10-CM | POA: Diagnosis not present

## 2022-10-14 ENCOUNTER — Ambulatory Visit (HOSPITAL_COMMUNITY): Payer: Medicare Other | Attending: Internal Medicine

## 2022-10-14 DIAGNOSIS — I255 Ischemic cardiomyopathy: Secondary | ICD-10-CM | POA: Diagnosis not present

## 2022-10-14 LAB — ECHOCARDIOGRAM COMPLETE
Area-P 1/2: 5.62 cm2
S' Lateral: 2.7 cm

## 2022-10-19 DIAGNOSIS — I252 Old myocardial infarction: Secondary | ICD-10-CM | POA: Diagnosis not present

## 2022-10-19 DIAGNOSIS — Z5189 Encounter for other specified aftercare: Secondary | ICD-10-CM | POA: Diagnosis not present

## 2022-10-20 DIAGNOSIS — Z5189 Encounter for other specified aftercare: Secondary | ICD-10-CM | POA: Diagnosis not present

## 2022-10-20 DIAGNOSIS — I252 Old myocardial infarction: Secondary | ICD-10-CM | POA: Diagnosis not present

## 2022-10-24 DIAGNOSIS — I252 Old myocardial infarction: Secondary | ICD-10-CM | POA: Diagnosis not present

## 2022-10-24 DIAGNOSIS — Z5189 Encounter for other specified aftercare: Secondary | ICD-10-CM | POA: Diagnosis not present

## 2022-10-26 DIAGNOSIS — Z5189 Encounter for other specified aftercare: Secondary | ICD-10-CM | POA: Diagnosis not present

## 2022-10-26 DIAGNOSIS — I252 Old myocardial infarction: Secondary | ICD-10-CM | POA: Diagnosis not present

## 2022-10-27 DIAGNOSIS — I252 Old myocardial infarction: Secondary | ICD-10-CM | POA: Diagnosis not present

## 2022-10-27 DIAGNOSIS — Z5189 Encounter for other specified aftercare: Secondary | ICD-10-CM | POA: Diagnosis not present

## 2022-11-02 DIAGNOSIS — Z5189 Encounter for other specified aftercare: Secondary | ICD-10-CM | POA: Diagnosis not present

## 2022-11-02 DIAGNOSIS — I252 Old myocardial infarction: Secondary | ICD-10-CM | POA: Diagnosis not present

## 2022-11-09 DIAGNOSIS — I252 Old myocardial infarction: Secondary | ICD-10-CM | POA: Diagnosis not present

## 2022-11-09 DIAGNOSIS — Z5189 Encounter for other specified aftercare: Secondary | ICD-10-CM | POA: Diagnosis not present

## 2022-11-10 ENCOUNTER — Emergency Department (HOSPITAL_BASED_OUTPATIENT_CLINIC_OR_DEPARTMENT_OTHER)
Admission: EM | Admit: 2022-11-10 | Discharge: 2022-11-10 | Disposition: A | Discharge status: Home / Self Care | Payer: Medicare Other | Attending: Emergency Medicine | Admitting: Emergency Medicine

## 2022-11-10 ENCOUNTER — Encounter (HOSPITAL_BASED_OUTPATIENT_CLINIC_OR_DEPARTMENT_OTHER): Payer: Self-pay | Admitting: Emergency Medicine

## 2022-11-10 ENCOUNTER — Other Ambulatory Visit: Payer: Self-pay

## 2022-11-10 ENCOUNTER — Ambulatory Visit (INDEPENDENT_AMBULATORY_CARE_PROVIDER_SITE_OTHER): Payer: Medicare Other | Admitting: Medical

## 2022-11-10 ENCOUNTER — Emergency Department (HOSPITAL_BASED_OUTPATIENT_CLINIC_OR_DEPARTMENT_OTHER): Payer: Medicare Other

## 2022-11-10 VITALS — BP 170/78 | HR 79 | Temp 98.0°F | Resp 18 | Ht 62.0 in | Wt 123.0 lb

## 2022-11-10 DIAGNOSIS — Z7902 Long term (current) use of antithrombotics/antiplatelets: Secondary | ICD-10-CM | POA: Diagnosis not present

## 2022-11-10 DIAGNOSIS — R030 Elevated blood-pressure reading, without diagnosis of hypertension: Secondary | ICD-10-CM | POA: Diagnosis not present

## 2022-11-10 DIAGNOSIS — Z79899 Other long term (current) drug therapy: Secondary | ICD-10-CM | POA: Insufficient documentation

## 2022-11-10 DIAGNOSIS — M79602 Pain in left arm: Secondary | ICD-10-CM | POA: Diagnosis not present

## 2022-11-10 DIAGNOSIS — Z7982 Long term (current) use of aspirin: Secondary | ICD-10-CM | POA: Insufficient documentation

## 2022-11-10 DIAGNOSIS — R079 Chest pain, unspecified: Secondary | ICD-10-CM | POA: Diagnosis not present

## 2022-11-10 DIAGNOSIS — I10 Essential (primary) hypertension: Secondary | ICD-10-CM | POA: Insufficient documentation

## 2022-11-10 LAB — BASIC METABOLIC PANEL
Anion gap: 7 (ref 5–15)
BUN: 24 mg/dL — ABNORMAL HIGH (ref 8–23)
CO2: 24 mmol/L (ref 22–32)
Calcium: 9 mg/dL (ref 8.9–10.3)
Chloride: 107 mmol/L (ref 98–111)
Creatinine, Ser: 1.26 mg/dL — ABNORMAL HIGH (ref 0.44–1.00)
GFR, Estimated: 46 mL/min — ABNORMAL LOW (ref >60)
Glucose, Bld: 95 mg/dL (ref 70–99)
Potassium: 4 mmol/L (ref 3.5–5.1)
Sodium: 138 mmol/L (ref 135–145)

## 2022-11-10 LAB — CBC
HCT: 31.8 % — ABNORMAL LOW (ref 36.0–46.0)
Hemoglobin: 10.4 g/dL — ABNORMAL LOW (ref 12.0–15.0)
MCH: 28.2 pg (ref 26.0–34.0)
MCHC: 32.7 g/dL (ref 30.0–36.0)
MCV: 86.2 fL (ref 80.0–100.0)
Platelets: 225 10*3/uL (ref 150–400)
RBC: 3.69 MIL/uL — ABNORMAL LOW (ref 3.87–5.11)
RDW: 12.2 % (ref 11.5–15.5)
WBC: 5.4 10*3/uL (ref 4.0–10.5)
nRBC: 0 % (ref 0.0–0.2)

## 2022-11-10 LAB — TROPONIN I (HIGH SENSITIVITY)
Troponin I (High Sensitivity): 9 ng/L (ref <18)
Troponin I (High Sensitivity): 10 ng/L (ref <18)

## 2022-11-10 MED ORDER — ACETAMINOPHEN 325 MG PO TABS
650.0000 mg | ORAL_TABLET | Freq: Once | ORAL | Status: AC
  Administered 2022-11-10: 650 mg via ORAL
  Filled 2022-11-10: qty 2

## 2022-11-10 MED ORDER — NITROGLYCERIN 0.4 MG SL SUBL
0.4000 mg | SUBLINGUAL_TABLET | SUBLINGUAL | Status: DC | PRN
  Administered 2022-11-10: 0.4 mg via SUBLINGUAL
  Filled 2022-11-10: qty 1

## 2022-11-10 MED ORDER — ASPIRIN 325 MG PO TABS
325.0000 mg | ORAL_TABLET | Freq: Every day | ORAL | Status: DC
  Administered 2022-11-10: 325 mg via ORAL
  Filled 2022-11-10: qty 1

## 2022-11-10 NOTE — ED Provider Notes (Signed)
[facility] Provider Note   CSN: [id number] Arrival date & time: 11/10/22  0930     History  Chief Complaint  Patient presents with   Arm Pain    Kelsey Stanley is a 72 y.o. female with a history of coronary disease status post MI with LAD stent in Sept 2023 (99% Mid-LAD lesion, [facility]), hypertension, on 81 mg aspirin and Plavix daily, presented ED as a referral from the PCP's office with concern for left arm ache.  Patient reports that her left forearm began aching 2 days ago.  It has been waxing and waning, not associated with exertion.  She currently is experiencing this symptom.  She was concerned because a symptom was similar to when she had her MI last year.  However at that time she had aching in both of her arms.  She had no other symptoms associated with it.  Her cardiologist is Dr Kirk Ruths at [facility].  She was taken to [facility] for cardiac cath last year because that was the closest hospital per EMS.  Update-patient later reported to me that she has been using Tylenol at home for her pain and achieving some relief with Tylenol.  HPI     Home Medications Prior to Admission medications   Medication Sig Start Date End Date Taking? Authorizing Provider  aspirin 81 MG chewable tablet Chew 81 mg by mouth daily.    [provider]  cholecalciferol (VITAMIN D) 400 units TABS tablet Take 400 Units by mouth daily.     [provider]  clopidogrel (PLAVIX) 75 MG tablet Take by mouth. 09/13/22 09/13/23  [provider]  levocetirizine (XYZAL) 5 MG tablet TAKE 1 TABLET EVERY EVENING 08/24/22   Wendling, Crosby Oyster, DO  lisinopril (ZESTRIL) 20 MG tablet Take 1 tablet (20 mg total) by mouth daily. 09/19/22   Lelon Perla, MD  metoprolol succinate (TOPROL-XL) 100 MG 24 hr tablet TAKE 1 TABLET DAILY. TAKE WITH OR IMMEDIATELY FOLLOWING A MEAL. 04/28/22   Shelda Pal, DO   NIFEdipine (PROCARDIA-XL/NIFEDICAL-XL) 30 MG 24 hr tablet Take 1 tab daily as needed. 09/20/22   Shelda Pal, DO  nitroGLYCERIN (NITROSTAT) 0.4 MG SL tablet Place under the tongue. 07/12/22   [provider]  pantoprazole (PROTONIX) 20 MG tablet TAKE 1 TABLET EVERY DAY 05/04/22   Wendling, Crosby Oyster, DO  Probiotic Product (PROBIOTIC-10 PO) Take 1 capsule by mouth daily.     [provider]  rosuvastatin (CRESTOR) 40 MG tablet Take 1 tablet (40 mg total) by mouth daily. 08/19/22   Shelda Pal, DO  valACYclovir (VALTREX) 1000 MG tablet Take 2 tabs and repeat in 12 hours in event of an outbreak. 04/05/21   Shelda Pal, DO      Allergies    Dilaudid [hydromorphone], Fentanyl, Morphine and related, Amoxicillin, and Erythromycin    Review of Systems   Review of Systems  Physical Exam Updated Vital Signs BP (!) 147/52   Pulse (!) 56   Temp 98.7 F (37.1 C) (Oral)   Resp 14   Ht '5\' 2"'$  (1.575 m)   Wt 55.8 kg   SpO2 100%   BMI 22.50 kg/m  Physical Exam Constitutional:      General: She is not in acute distress. HENT:     Head: Normocephalic and atraumatic.  Eyes:     Conjunctiva/sclera: Conjunctivae normal.     Pupils: Pupils are equal, round, and  reactive to light.  Cardiovascular:     Rate and Rhythm: Normal rate and regular rhythm.     Pulses: Normal pulses.  Pulmonary:     Effort: Pulmonary effort is normal. No respiratory distress.  Abdominal:     General: There is no distension.     Tenderness: There is no abdominal tenderness.  Skin:    General: Skin is warm and dry.  Neurological:     General: No focal deficit present.     Mental Status: She is alert and oriented to person, place, and time. Mental status is at baseline.  Psychiatric:        Mood and Affect: Mood normal.        Behavior: Behavior normal.     ED Results / Procedures / Treatments   Labs (all labs ordered are listed, but only abnormal results are  displayed) Labs Reviewed  BASIC METABOLIC PANEL - Abnormal; Notable for the following components:      Result Value   BUN 24 (*)    Creatinine, Ser 1.26 (*)    GFR, Estimated 46 (*)    All other components within normal limits  CBC - Abnormal; Notable for the following components:   RBC 3.69 (*)    Hemoglobin 10.4 (*)    HCT 31.8 (*)    All other components within normal limits  TROPONIN I (HIGH SENSITIVITY)  TROPONIN I (HIGH SENSITIVITY)    EKG EKG Interpretation  Date/Time:  Thursday November 10 2022 09:46:05 EST Ventricular Rate:  65 PR Interval:  146 QRS Duration: 100 QT Interval:  415 QTC Calculation: 432 R Axis:   60 Text Interpretation: Sinus rhythm Confirmed by Octaviano Glow [phone]) on 11/10/2022 9:48:07 AM  Radiology DG Chest Port 1 View  Result Date: 11/10/2022 CLINICAL DATA:  Arm pain EXAM: PORTABLE CHEST 1 VIEW COMPARISON:  05/07/2020 FINDINGS: The heart size and mediastinal contours are within normal limits. Both lungs are clear. The visualized skeletal structures are unremarkable. IMPRESSION: No active disease. Electronically Signed   By: Kathreen Devoid M.D.   On: 11/10/2022 10:26    Procedures Procedures    Medications Ordered in ED Medications  nitroGLYCERIN (NITROSTAT) SL tablet 0.4 mg (0.4 mg Sublingual Given 11/10/22 1017)  aspirin tablet 325 mg (325 mg Oral Given 11/10/22 1017)  acetaminophen (TYLENOL) tablet 650 mg (650 mg Oral Given 11/10/22 1016)    ED Course/ Medical Decision Making/ A&P Clinical Course as of 11/10/22 1448  Thu Nov 10, 2022  1342 Patient reassessed and is pain-free.  She does not feel that her pain resolves quickly after nitroglycerin.  She wonders whether the nitroglycerin is working because it did not cause any headache as it typically does.  She also questions whether perhaps the Tylenol improved her pain, Tylenol which was ordered preventatively for headache.  This is a possibility.  Her initial troponin is unremarkable. [MT]   8295 I spoke to Dr Harrell Gave from cardiology who reviewed the patient's case with me.  Given the fact that the patient is asymptomatic, with a negative delta troponin and a normal EKG, it is reasonable for the patient to be discharged with close outpatient follow-up.  Dr.Christopher will contact Dr Stanford Breed who is the patient's main cardiologist for office evaluation.  The patient herself is wanting to go home.  Her husband is present at the bedside and in agreement.  I recommended that if her pain returns she tried Tylenol first, she now tells me she has been using Tylenol with  some relief of her symptoms over the weekend.  If this is not working they can also try nitroglycerin.  If there are persistent or new or worsening symptoms she should return to the ER. [MT]    Clinical Course User Index [MT] Corabelle Spackman, Carola Rhine, MD                             Medical Decision Making Amount and/or Complexity of Data Reviewed Labs: ordered. Radiology: ordered.  Risk OTC drugs. Prescription drug management.   This patient presents to the Emergency Department with complaint of arm discomfort. This involves an extensive number of treatment options, and is a complaint that carries with it a high risk of complications and morbidity, given the patient's comorbidity, including known coronary disease.The differential diagnosis includes musculoskeletal pain versus radiculopathy versus muscle strain versus atypical ACS versus other.  The patient does have some history in the past suggestive that this could be an atypical ACS presentation with pain in her left arm.  However in the past the pain was in both arms.  Also unusual in this case is the fact that her pain has improved at home with Tylenol which she has been taking the past 2 days.  This was suggest more of a muscular etiology than coronary, but her full cardiac workup is pending.  I felt PE was less likely given that her symptoms are intermittent, she has  no acute PE risk factors, no tachycardia or hypoxia  I ordered, reviewed, and interpreted labs.  Pertinent results include no significant or emergent findings I ordered medication nitroglycerin, aspirin, Tylenol for chest pain I ordered imaging studies which included x-ray of the chest I independently visualized and interpreted imaging which showed no acute emergent findings and the monitor tracing which showed normal sinus rhythm. I agree with the radiologist interpretation External records obtained and reviewed showing LHC report w/ 80% intermittent RCA lesions and 99% mid-LAD lesions now stented I personally reviewed the patients ECG which showed sinus rhythm with no acute ischemic findings  After the interventions stated above, I reevaluated the patient and found that they were asymptomatic  I consulted with cardiology by phone.  Please see ED course.  Based on the patient's clinical exam, vital signs, risk factors, and ED testing, I felt that the patient's overall risk of life-threatening emergency such as ACS, PE, sepsis, or infection was low.  At this time, I felt the patient's presentation was most clinically consistent with nonspecific arm pain, but explained to the patient that this evaluation was not a definitive diagnostic workup.  I discussed outpatient follow up with primary care provider, and provided specialist office number on the patient's discharge paper if a referral was deemed necessary.  Return precautions were discussed with the patient.  I felt the patient was clinically stable for discharge.         Final Clinical Impression(s) / ED Diagnoses Final diagnoses:  Left arm pain    Rx / DC Orders ED Discharge Orders     None         Julez Huseby, Carola Rhine, MD 11/10/22 [phone]

## 2022-11-10 NOTE — Discharge Instructions (Signed)
If your pain returns you can take Tylenol at home for relief.  If the Tylenol is not relieving your pain, or you begin having worsening chest pressure, lightheadedness, difficulty breathing, dizziness, loss of consciousness, please call [phone] and return to the ER immediately.

## 2022-11-10 NOTE — Progress Notes (Signed)
   Subjective:    Patient ID: Kelsey Stanley, female    DOB: [birth year], 72 y.o.   MRN: [id number]  HPI Pt in for left forearm pain x 3 days. States pain from her elbow to her wrist. Pain started on Friday after doing exercises on Thursday.   She states has nagging pain. She describes to me that in September she had nagging pain in her forearms before she had her MI.  Pt was at cardiac rehab and nurse made her stop exercising based on her forearm pain.   Pt is on plavix, crestor and aspirin  Pt bp is high today. States not this high yesterday. Was 140/70. To day 180/80. On lisinopril 20 mg daily, toprol xl 100 mg daily and nifedipine 30 mg daily.  No chest pain and not shortness of breath.   Yesterday at  cardiac rehab clinic  "Patient mentioned that for the last 3 days she has been having pain like sensation in her left arm that feels similar to when she had her NSTEMI. She denies CP. She mentioned that it is intermittent and does not get worse with activity. Advised pt to either go to the ED, urgent care, or contact her doctor. Patient opted to call her doctor."  Review of Systems  Constitutional:  Negative for chills, fatigue and fever.  Respiratory:  Negative for cough, chest tightness, shortness of breath and wheezing.   Cardiovascular:  Negative for chest pain and palpitations.  Gastrointestinal:  Negative for abdominal pain.  Musculoskeletal:  Negative for back pain and myalgias.  Skin:  Negative for rash.       Objective:   Physical Exam  General- No acute distress. Pleasant patient. Neck- Full range of motion, no jvd Lungs- Clear, even and unlabored. Heart- regular rate and rhythm. Neurologic- CNII- XII grossly intact.    Left shoulder-full range of motion with no pain. Left elbow-no pain on flexion and extension. Left forearm-on supination and pronation no tenderness reported.  No pain on flexion extension of the wrist.  On palpation of the lateral epicondyle  could not produce any significant pain.    Assessment & Plan:   Patient Instructions  Recent recurrent left forearm pain that on exam does not appear to be musculoskeletal in nature.  As you did have history of MI in the past with stent placement and prior to that diagnosis you distinctly described having forearm pain preceding previous MI.  Your blood pressure is also very high today.  Based on your history I do think it is best that you be evaluated emergency department.  I did talk with emergency department physician today prior to you going down.  Follow-up with your PCP after ED evaluation or sooner if needed.   Time spent with patient today was   minutes which consisted of chart revdiew, discussing diagnosis, work up treatment and documentation.

## 2022-11-10 NOTE — Patient Instructions (Addendum)
Recent recurrent left forearm pain that on exam does not appear to be musculoskeletal in nature.  As you did have history of MI in the past with stent placement and prior to that diagnosis you distinctly described having forearm pain preceding previous MI.  Your blood pressure is also very high today.  Based on your history I do think it is best that you be evaluated emergency department.  I did talk with emergency department physician today prior to you going down.  EKG shows sinus rhythm within normal limits.   Follow-up with your PCP after ED evaluation or sooner if needed.

## 2022-11-10 NOTE — ED Notes (Signed)
[place]az signature not working. Pt and family verbalized understanding with discharge instructions.

## 2022-11-10 NOTE — ED Triage Notes (Addendum)
Pt states she is having arm aching since Friday.  Pt had exercised on Thursday on arm machine .  No chest pain, no shortness of breath, no weakness, no numbness.  Pt did travel to the beach Thursday.

## 2022-11-11 DIAGNOSIS — H40013 Open angle with borderline findings, low risk, bilateral: Secondary | ICD-10-CM | POA: Diagnosis not present

## 2022-11-14 DIAGNOSIS — D225 Melanocytic nevi of trunk: Secondary | ICD-10-CM | POA: Diagnosis not present

## 2022-11-14 DIAGNOSIS — Z85828 Personal history of other malignant neoplasm of skin: Secondary | ICD-10-CM | POA: Diagnosis not present

## 2022-11-14 DIAGNOSIS — Q690 Accessory finger(s): Secondary | ICD-10-CM | POA: Diagnosis not present

## 2022-11-14 DIAGNOSIS — D485 Neoplasm of uncertain behavior of skin: Secondary | ICD-10-CM | POA: Diagnosis not present

## 2022-11-14 DIAGNOSIS — L821 Other seborrheic keratosis: Secondary | ICD-10-CM | POA: Diagnosis not present

## 2022-11-14 DIAGNOSIS — B078 Other viral warts: Secondary | ICD-10-CM | POA: Diagnosis not present

## 2022-11-14 DIAGNOSIS — L814 Other melanin hyperpigmentation: Secondary | ICD-10-CM | POA: Diagnosis not present

## 2022-11-16 DIAGNOSIS — Z5189 Encounter for other specified aftercare: Secondary | ICD-10-CM | POA: Diagnosis not present

## 2022-11-16 DIAGNOSIS — I252 Old myocardial infarction: Secondary | ICD-10-CM | POA: Diagnosis not present

## 2022-11-17 ENCOUNTER — Encounter: Payer: Self-pay | Admitting: Nurse Practitioner

## 2022-11-17 ENCOUNTER — Ambulatory Visit: Payer: Medicare Other | Attending: Nurse Practitioner | Admitting: Nurse Practitioner

## 2022-11-17 VITALS — BP 160/86 | HR 75 | Ht 62.0 in | Wt 121.6 lb

## 2022-11-17 DIAGNOSIS — M79602 Pain in left arm: Secondary | ICD-10-CM

## 2022-11-17 DIAGNOSIS — Z5189 Encounter for other specified aftercare: Secondary | ICD-10-CM | POA: Diagnosis not present

## 2022-11-17 DIAGNOSIS — I255 Ischemic cardiomyopathy: Secondary | ICD-10-CM

## 2022-11-17 DIAGNOSIS — I2510 Atherosclerotic heart disease of native coronary artery without angina pectoris: Secondary | ICD-10-CM | POA: Diagnosis not present

## 2022-11-17 DIAGNOSIS — I340 Nonrheumatic mitral (valve) insufficiency: Secondary | ICD-10-CM

## 2022-11-17 DIAGNOSIS — I252 Old myocardial infarction: Secondary | ICD-10-CM | POA: Diagnosis not present

## 2022-11-17 DIAGNOSIS — E785 Hyperlipidemia, unspecified: Secondary | ICD-10-CM | POA: Diagnosis not present

## 2022-11-17 DIAGNOSIS — I10 Essential (primary) hypertension: Secondary | ICD-10-CM | POA: Diagnosis not present

## 2022-11-17 NOTE — Progress Notes (Signed)
Office Visit    Patient Name: Kelsey Stanley Date of Encounter: 11/17/2022  Primary Care Provider:  Shelda Pal, DO Primary Cardiologist:  Kirk Ruths, MD  Chief Complaint    72 year old female with a history of CAD s/p DES-LAD in 06/2022, ICM, mitral valve regurgitation, hypertension and hyperlipidemia who presents for follow-up related to CAD and chest pain.  Past Medical History    Past Medical History:  Diagnosis Date   CAD (coronary artery disease)    GERD (gastroesophageal reflux disease)    Hyperlipidemia    Hypertension    Past Surgical History:  Procedure Laterality Date   ABDOMINAL HYSTERECTOMY  1993   BREAST BIOPSY Right 1994   CHOLECYSTECTOMY     CHOLECYSTECTOMY, LAPAROSCOPIC  1993    Allergies  Allergies  Allergen Reactions   Midazolam Nausea And Vomiting   Dilaudid [Hydromorphone] Nausea And Vomiting   Fentanyl Nausea And Vomiting   Morphine And Related Other (See Comments)    Unable to communicate   Amoxicillin Rash   Erythromycin Rash     Labs/Other Studies Reviewed    The following studies were reviewed today: Echo 10/14/2022: IMPRESSIONS     1. Left ventricular ejection fraction, by estimation, is 60 to 65%. The  left ventricle has normal function. The left ventricle has no regional  wall motion abnormalities. Left ventricular diastolic parameters were  normal.   2. Right ventricular systolic function is normal. The right ventricular  size is normal. There is normal pulmonary artery systolic pressure. The  estimated right ventricular systolic pressure is 32.6 mmHg.   3. The mitral valve is abnormal. Mild mitral valve regurgitation.   4. The aortic valve is tricuspid. Aortic valve regurgitation is not  visualized.   5. The inferior vena cava is normal in size with greater than 50%  respiratory variability, suggesting right atrial pressure of 3 mmHg.   Comparison(s): Changes from prior study are noted. 05/08/2020: LVEF  60-65%,  trivial MR.    Recent Labs: 09/20/2022: ALT 23 11/10/2022: BUN 24; Creatinine, Ser 1.26; Hemoglobin 10.4; Platelets 225; Potassium 4.0; Sodium 138  Recent Lipid Panel    Component Value Date/Time   CHOL 136 09/20/2022 0833   TRIG 134 09/20/2022 0833   HDL 49 09/20/2022 0833   CHOLHDL 2.8 09/20/2022 0833   CHOLHDL 4 06/10/2022 0942   VLDL 31.4 06/10/2022 0942   LDLCALC 64 09/20/2022 0833    History of Present Illness    72 year old female with the above past medical history including CAD s/p DES-LAD in 06/2022, ICM, mitral valve regurgitation, hypertension and hyperlipidemia.  Previously followed by [facility].  She was hospitalized in September 2023 in the setting of NSTEMI.  Cath showed 99% LAD, 50% RI, and 80% nondominant left circumflex stenoses.  She underwent PCI/DES-LAD.  Echocardiogram showed EF 35 to 40%, mild MR, and mild TR.  She transitioned care to Dr. Stanford Breed in 09/2022.  She was last seen in the office on 09/19/2022 and was stable from a cardiac standpoint.  She noted that her prior anginal equivalent was a sensation in the upper extremities from the elbow down bilaterally and a burning sensation in her chest with activities.  She did note some orthostatic symptoms, BP was borderline low, lisinopril was decreased to 20 mg once daily.  Repeat echocardiogram showed EF 60 to 65%, normal LV function, no RWMA, no LVH, normal RV systolic function, mild mitral valve regurgitation, trivial TR.  She presented to the ED on 11/10/2022 with  complaints of left forearm aching, partially relieved with Tylenol.  She was concerned, however, that her symptoms were somewhat similar to her prior anginal equivalent.  EKG was unremarkable, troponin was normal.  Case was discussed with cardiology who agreed that symptoms were atypical and likely more musculoskeletal in nature, outpatient follow-up was recommended.  She presents today for follow-up accompanied by her husband.  Since her ED  visit she has been stable from a cardiac standpoint.  She denies any recurrent arm discomfort.  She notes that prior to her arm discomfort she had used an arm crank machine during cardiac rehab.  She thinks this may have contributed to her symptoms.  She has continued to exercise in cardiac rehab and denies any exertional symptoms though she does note that prior to her MI she did not experience exertional symptoms.  Her BP is elevated today.  She notes that her BP has been well-controlled at home and  she believes she has "whitecoat hypertension."   Home Medications    Current Outpatient Medications  Medication Sig Dispense Refill   aspirin 81 MG chewable tablet Chew 81 mg by mouth daily.     cholecalciferol (VITAMIN D) 400 units TABS tablet Take 400 Units by mouth daily.      clopidogrel (PLAVIX) 75 MG tablet Take by mouth.     levocetirizine (XYZAL) 5 MG tablet TAKE 1 TABLET EVERY EVENING 90 tablet 10   lisinopril (ZESTRIL) 20 MG tablet Take 1 tablet (20 mg total) by mouth daily. 90 tablet 3   metoprolol succinate (TOPROL-XL) 100 MG 24 hr tablet TAKE 1 TABLET DAILY. TAKE WITH OR IMMEDIATELY FOLLOWING A MEAL. 90 tablet 2   nitroGLYCERIN (NITROSTAT) 0.4 MG SL tablet Place under the tongue.     pantoprazole (PROTONIX) 20 MG tablet TAKE 1 TABLET EVERY DAY 90 tablet 2   Probiotic Product (PROBIOTIC-10 PO) Take 1 capsule by mouth daily.      rosuvastatin (CRESTOR) 40 MG tablet Take 1 tablet (40 mg total) by mouth daily. 90 tablet 3   valACYclovir (VALTREX) 1000 MG tablet Take 2 tabs and repeat in 12 hours in event of an outbreak. 90 tablet 2   NIFEdipine (PROCARDIA-XL/NIFEDICAL-XL) 30 MG 24 hr tablet Take 1 tab daily as needed. (Patient not taking: Reported on 11/17/2022) 30 tablet 2   No current facility-administered medications for this visit.     Review of Systems    She denies chest pain, palpitations, dyspnea, pnd, orthopnea, n, v, dizziness, syncope, edema, weight gain, or early satiety. All  other systems reviewed and are otherwise negative except as noted above.   Physical Exam    VS:  BP (!) 160/86   Pulse 75   Ht '5\' 2"'$  (1.575 m)   Wt 121 lb 9.6 oz (55.2 kg)   SpO2 93%   BMI 22.24 kg/m   GEN: Well nourished, well developed, in no acute distress. HEENT: normal. Neck: Supple, no JVD, carotid bruits, or masses. Cardiac: RRR, no murmurs, rubs, or gallops. No clubbing, cyanosis, edema.  Radials/DP/PT 2+ and equal bilaterally.  Respiratory:  Respirations regular and unlabored, clear to auscultation bilaterally. GI: Soft, nontender, nondistended, BS + x 4. MS: no deformity or atrophy. Skin: warm and dry, no rash. Neuro:  Strength and sensation are intact. Psych: Normal affect.  Accessory Clinical Findings    ECG personally reviewed by me today - No EKG in office today.    Lab Results  Component Value Date   WBC 5.4 11/10/2022  HGB 10.4 (L) 11/10/2022   HCT 31.8 (L) 11/10/2022   MCV 86.2 11/10/2022   PLT 225 11/10/2022   Lab Results  Component Value Date   CREATININE 1.26 (H) 11/10/2022   BUN 24 (H) 11/10/2022   NA 138 11/10/2022   K 4.0 11/10/2022   CL 107 11/10/2022   CO2 24 11/10/2022   Lab Results  Component Value Date   ALT 23 09/20/2022   AST 25 09/20/2022   ALKPHOS 77 09/20/2022   BILITOT 0.2 09/20/2022   Lab Results  Component Value Date   CHOL 136 09/20/2022   HDL 49 09/20/2022   LDLCALC 64 09/20/2022   TRIG 134 09/20/2022   CHOLHDL 2.8 09/20/2022    Lab Results  Component Value Date   HGBA1C 6.4 06/10/2022    Assessment & Plan    1. CAD/Left arm pain: S/p DES-LAD in 06/2022.  She had recent left forearm pain that occurred after she used an arm crate machine during cardiac rehab.  She denies any exertional symptoms.  She notes that her prior anginal equivalent was arm pain and she never had exertional symptoms.  Her left arm discomfort improved with Tylenol and has now resolved.  Continue to monitor for symptoms of angina.  Could  consider addition of long-acting nitrate.  If symptoms persist, may need to consider further ischemic evaluation with possible stress testing, will defer at this time.  Discussed ED precautions, continue aspirin, Plavix, lisinopril, metoprolol, and Crestor.  2. ICM: Echo at the time of her NSTEMI showed EF 35 to 40%, mild MR, and, mild TR.  Repeat echo in 09/2022 showed recovery of EF, 60 to 65%, normal LV function, mild mitral valve regurgitation, trivial TR. Euvolemic and well compensated on exam. Continue current medications as above.  3. Hypertension: BP is elevated above goal in office today.  Patient states her BP has been well-controlled at home and is "always elevated in the doctor's office."  She states she has "whitecoat coat hypertension."  Continue to monitor BP at home and report BP consistently greater than 130/80.  If BP remains elevated above goal could consider increasing lisinopril to previous dose of 40 mg daily.  For now, continue current antihypertensive regimen.  4. Hyperlipidemia: LDL was 64 in 09/2022.  Continue Crestor.  5. Mitral valve regurgitation: Mild on most recent echo.  Asymptomatic.  6. Disposition: Follow-up as scheduled with Dr. Stanford Breed in 03/2023, sooner if needed.  HYPERTENSION CONTROL Vitals:   11/17/22 0818 11/17/22 0840  BP: (!) 148/76 (!) 160/86    The patient's blood pressure is elevated above target today.  In order to address the patient's elevated BP: Blood pressure will be monitored at home to determine if medication changes need to be made.     Lenna Sciara, NP 11/17/2022, 4:45 PM

## 2022-11-17 NOTE — Patient Instructions (Signed)
Medication Instructions:  Your physician recommends that you continue on your current medications as directed. Please refer to the Current Medication list given to you today.   *If you need a refill on your cardiac medications before your next appointment, please call your pharmacy*   Lab Work: NONE ordered at this time of appointment   If you have labs (blood work) drawn today and your tests are completely normal, you will receive your results only by: [place] (if you have MyChart) OR A paper copy in the mail If you have any lab test that is abnormal or we need to change your treatment, we will call you to review the results.   Testing/Procedures: NONE ordered at this time of appointment     Follow-Up: At [facility], you and your health needs are our priority.  As part of our continuing mission to provide you with exceptional heart care, we have created designated Provider Care Teams.  These Care Teams include your primary Cardiologist (physician) and Advanced Practice Providers (APPs -  Physician Assistants and Nurse Practitioners) who all work together to provide you with the care you need, when you need it.  We recommend signing up for the patient portal called "MyChart".  Sign up information is provided on this After Visit Summary.  MyChart is used to connect with patients for Virtual Visits (Telemedicine).  Patients are able to view lab/test results, encounter notes, upcoming appointments, etc.  Non-urgent messages can be sent to your provider as well.   To learn more about what you can do with MyChart, go to [email or web address].    Your next appointment:    Keep follow up   Provider:   Kirk Ruths, MD     Other Instructions Monitor Blood pressure. Goal is 130/80 or less (report BP that is consistently higher).

## 2022-11-21 ENCOUNTER — Encounter: Payer: Self-pay | Admitting: Family Medicine

## 2022-11-21 DIAGNOSIS — I252 Old myocardial infarction: Secondary | ICD-10-CM | POA: Diagnosis not present

## 2022-11-21 DIAGNOSIS — Z5189 Encounter for other specified aftercare: Secondary | ICD-10-CM | POA: Diagnosis not present

## 2022-11-21 MED ORDER — ROSUVASTATIN CALCIUM 40 MG PO TABS: 40.0000 mg | ORAL_TABLET | Freq: Every day | ORAL | 3 refills | Status: DC

## 2022-11-23 DIAGNOSIS — Z5189 Encounter for other specified aftercare: Secondary | ICD-10-CM | POA: Diagnosis not present

## 2022-11-23 DIAGNOSIS — I252 Old myocardial infarction: Secondary | ICD-10-CM | POA: Diagnosis not present

## 2022-11-24 DIAGNOSIS — Z5189 Encounter for other specified aftercare: Secondary | ICD-10-CM | POA: Diagnosis not present

## 2022-11-24 DIAGNOSIS — I252 Old myocardial infarction: Secondary | ICD-10-CM | POA: Diagnosis not present

## 2022-11-28 DIAGNOSIS — I252 Old myocardial infarction: Secondary | ICD-10-CM | POA: Diagnosis not present

## 2022-11-28 DIAGNOSIS — Z5189 Encounter for other specified aftercare: Secondary | ICD-10-CM | POA: Diagnosis not present

## 2022-11-30 DIAGNOSIS — I252 Old myocardial infarction: Secondary | ICD-10-CM | POA: Diagnosis not present

## 2022-11-30 DIAGNOSIS — Z5189 Encounter for other specified aftercare: Secondary | ICD-10-CM | POA: Diagnosis not present

## 2022-12-12 ENCOUNTER — Ambulatory Visit (INDEPENDENT_AMBULATORY_CARE_PROVIDER_SITE_OTHER): Payer: Medicare Other | Admitting: Family Medicine

## 2022-12-12 ENCOUNTER — Encounter: Payer: Self-pay | Admitting: Family Medicine

## 2022-12-12 VITALS — BP 120/70 | HR 68 | Temp 98.7°F | Ht 62.0 in | Wt 122.4 lb

## 2022-12-12 DIAGNOSIS — M25511 Pain in right shoulder: Secondary | ICD-10-CM

## 2022-12-12 DIAGNOSIS — J310 Chronic rhinitis: Secondary | ICD-10-CM

## 2022-12-12 DIAGNOSIS — E785 Hyperlipidemia, unspecified: Secondary | ICD-10-CM | POA: Diagnosis not present

## 2022-12-12 DIAGNOSIS — I10 Essential (primary) hypertension: Secondary | ICD-10-CM

## 2022-12-12 DIAGNOSIS — G8929 Other chronic pain: Secondary | ICD-10-CM

## 2022-12-12 MED ORDER — LEVOCETIRIZINE DIHYDROCHLORIDE 5 MG PO TABS: 5.0000 mg | ORAL_TABLET | Freq: Every evening | ORAL | 3 refills | Status: DC

## 2022-12-12 MED ORDER — LISINOPRIL 20 MG PO TABS: 20.0000 mg | ORAL_TABLET | Freq: Every day | ORAL | 3 refills | Status: DC

## 2022-12-12 MED ORDER — CLOPIDOGREL BISULFATE 75 MG PO TABS: 75.0000 mg | ORAL_TABLET | Freq: Every day | ORAL | 3 refills | Status: DC

## 2022-12-12 NOTE — Patient Instructions (Addendum)
Keep the diet clean and stay active.  Heat (pad or rice pillow in microwave) over affected area, 10-15 minutes twice daily.   Ice/cold pack over area for 10-15 min twice daily.  OK to take Tylenol 1000 mg (2 extra strength tabs) or 975 mg (3 regular strength tabs) every 6 hours as needed.  If you do not hear anything about your referral in the next 1-2 weeks, call our office and ask for an update.  Let us know if you need anything.  EXERCISES  RANGE OF MOTION (ROM) AND STRETCHING EXERCISES These exercises may help you when beginning to rehabilitate your injury. While completing these exercises, remember:  Restoring tissue flexibility helps normal motion to return to the joints. This allows healthier, less painful movement and activity. An effective stretch should be held for at least 30 seconds. A stretch should never be painful. You should only feel a gentle lengthening or release in the stretched tissue.  ROM - Pendulum Bend at the waist so that your right / left arm falls away from your body. Support yourself with your opposite hand on a solid surface, such as a table or a countertop. Your right / left arm should be perpendicular to the ground. If it is not perpendicular, you need to lean over farther. Relax the muscles in your right / left arm and shoulder as much as possible. Gently sway your hips and trunk so they move your right / left arm without any use of your right / left shoulder muscles. Progress your movements so that your right / left arm moves side to side, then forward and backward, and finally, both clockwise and counterclockwise. Complete 10-15 repetitions in each direction. Many people use this exercise to relieve discomfort in their shoulder as well as to gain range of motion. Repeat 2 times. Complete this exercise 3 times per week.  STRETCH - Flexion, Standing Stand with good posture. With an underhand grip on your right / left hand and an overhand grip on the  opposite hand, grasp a broomstick or cane so that your hands are a little more than shoulder-width apart. Keeping your right / left elbow straight and shoulder muscles relaxed, push the stick with your opposite hand to raise your right / left arm in front of your body and then overhead. Raise your arm until you feel a stretch in your right / left shoulder, but before you have increased shoulder pain. Try to avoid shrugging your right / left shoulder as your arm rises by keeping your shoulder blade tucked down and toward your mid-back spine. Hold 30 seconds. Slowly return to the starting position. Repeat 2 times. Complete this exercise 3 times per week.  STRETCH - Internal Rotation Place your right / left hand behind your back, palm-up. Throw a towel or belt over your opposite shoulder. Grasp the towel/belt with your right / left hand. While keeping an upright posture, gently pull up on the towel/belt until you feel a stretch in the front of your right / left shoulder. Avoid shrugging your right / left shoulder as your arm rises by keeping your shoulder blade tucked down and toward your mid-back spine. Hold 30. Release the stretch by lowering your opposite hand. Repeat 2 times. Complete this exercise 3 times per week.  STRETCH - External Rotation and Abduction Stagger your stance through a doorframe. It does not matter which foot is forward. As instructed by your physician, physical therapist or athletic trainer, place your hands: And forearms above  your head and on the door frame. And forearms at head-height and on the door frame. At elbow-height and on the door frame. Keeping your head and chest upright and your stomach muscles tight to prevent over-extending your low-back, slowly shift your weight onto your front foot until you feel a stretch across your chest and/or in the front of your shoulders. Hold 30 seconds. Shift your weight to your back foot to release the stretch. Repeat 2 times.  Complete this stretch 3 times per week.   STRENGTHENING EXERCISES  These exercises may help you when beginning to rehabilitate your injury. They may resolve your symptoms with or without further involvement from your physician, physical therapist or athletic trainer. While completing these exercises, remember:  Muscles can gain both the endurance and the strength needed for everyday activities through controlled exercises. Complete these exercises as instructed by your physician, physical therapist or athletic trainer. Progress the resistance and repetitions only as guided. You may experience muscle soreness or fatigue, but the pain or discomfort you are trying to eliminate should never worsen during these exercises. If this pain does worsen, stop and make certain you are following the directions exactly. If the pain is still present after adjustments, discontinue the exercise until you can discuss the trouble with your clinician. If advised by your physician, during your recovery, avoid activity or exercises which involve actions that place your right / left hand or elbow above your head or behind your back or head. These positions stress the tissues which are trying to heal.  STRENGTH - Scapular Depression and Adduction With good posture, sit on a firm chair. Supported your arms in front of you with pillows, arm rests or a table top. Have your elbows in line with the sides of your body. Gently draw your shoulder blades down and toward your mid-back spine. Gradually increase the tension without tensing the muscles along the top of your shoulders and the back of your neck. Hold for 3 seconds. Slowly release the tension and relax your muscles completely before completing the next repetition. After you have practiced this exercise, remove the arm support and complete it in standing as well as sitting. Repeat 2 times. Complete this exercise 3 times per week.   STRENGTH - External Rotators Secure a  rubber exercise band/tubing to a fixed object so that it is at the same height as your right / left elbow when you are standing or sitting on a firm surface. Stand or sit so that the secured exercise band/tubing is at your side that is not injured. Bend your elbow 90 degrees. Place a folded towel or small pillow under your right / left arm so that your elbow is a few inches away from your side. Keeping the tension on the exercise band/tubing, pull it away from your body, as if pivoting on your elbow. Be sure to keep your body steady so that the movement is only coming from your shoulder rotating. Hold 3 seconds. Release the tension in a controlled manner as you return to the starting position. Repeat 2 times. Complete this exercise 3 times per week.   STRENGTH - Supraspinatus Stand or sit with good posture. Grasp a 2-3 lb weight or an exercise band/tubing so that your hand is "thumbs-up," like when you shake hands. Slowly lift your right / left hand from your thigh into the air, traveling about 30 degrees from straight out at your side. Lift your hand to shoulder height or as far as you  can without increasing any shoulder pain. Initially, many people do not lift their hands above shoulder height. Avoid shrugging your right / left shoulder as your arm rises by keeping your shoulder blade tucked down and toward your mid-back spine. Hold for 3 seconds. Control the descent of your hand as you slowly return to your starting position. Repeat 2 times. Complete this exercise 3 times per week.   STRENGTH - Shoulder Extensors Secure a rubber exercise band/tubing so that it is at the height of your shoulders when you are either standing or sitting on a firm arm-less chair. With a thumbs-up grip, grasp an end of the band/tubing in each hand. Straighten your elbows and lift your hands straight in front of you at shoulder height. Step back away from the secured end of band/tubing until it becomes tense. Squeezing  your shoulder blades together, pull your hands down to the sides of your thighs. Do not allow your hands to go behind you. Hold for 3 seconds. Slowly ease the tension on the band/tubing as you reverse the directions and return to the starting position. Repeat 2 times. Complete this exercise 3 times per week.   STRENGTH - Scapular Retractors Secure a rubber exercise band/tubing so that it is at the height of your shoulders when you are either standing or sitting on a firm arm-less chair. With a palm-down grip, grasp an end of the band/tubing in each hand. Straighten your elbows and lift your hands straight in front of you at shoulder height. Step back away from the secured end of band/tubing until it becomes tense. Squeezing your shoulder blades together, draw your elbows back as you bend them. Keep your upper arm lifted away from your body throughout the exercise. Hold 3 seconds. Slowly ease the tension on the band/tubing as you reverse the directions and return to the starting position. Repeat 2 times. Complete this exercise 3 times per week.  STRENGTH - Scapular Depressors Find a sturdy chair without wheels, such as a from a dining room table. Keeping your feet on the floor, lift your bottom from the seat and lock your elbows. Keeping your elbows straight, allow gravity to pull your body weight down. Your shoulders will rise toward your ears. Raise your body against gravity by drawing your shoulder blades down your back, shortening the distance between your shoulders and ears. Although your feet should always maintain contact with the floor, your feet should progressively support less body weight as you get stronger. Hold 3 seconds. In a controlled and slow manner, lower your body weight to begin the next repetition. Repeat 2 times. Complete this exercise 3 times per week.    This information is not intended to replace advice given to you by your health care provider. Make sure you discuss any  questions you have with your health care provider.   Document Released: 08/17/2005 Document Revised: 10/24/2014 Document Reviewed: 01/15/2009 Elsevier Interactive Patient Education [employer].

## 2022-12-12 NOTE — Progress Notes (Signed)
Chief Complaint  Patient presents with   Follow-up    6 month Right shoulder still hurting    Subjective Kelsey Stanley is a 72 y.o. female who presents for hypertension follow up. She does monitor home blood pressures. Blood pressures ranging from 110-120's/70's on average. She is compliant with medications- lisinopril 20 mg/d, Toprol XL 100 mg/d. Patient has these side effects of medication: none She is adhering to a healthy diet overall. Current exercise: walking, wt resistance exercise No CP or SOB.  Hyperlipidemia Patient presents for hyperlipidemia follow up. Currently being treated with Crestor 40 mg/d and compliance with treatment thus far has been good. She denies myalgias. Diet/exercise as above.  The patient is known to have coexisting coronary artery disease.   Past Medical History:  Diagnosis Date   CAD (coronary artery disease)    GERD (gastroesophageal reflux disease)    Hyperlipidemia    Hypertension     Exam BP 120/70 (BP Location: Right Arm, Patient Position: Sitting, Cuff Size: Normal)   Pulse 68   Temp 98.7 F (37.1 C) (Oral)   Ht '5\' 2"'$  (1.575 m)   Wt 122 lb 6 oz (55.5 kg)   SpO2 96%   BMI 22.38 kg/m  General:  well developed, well nourished, in no apparent distress Heart: RRR, no bruits, no LE edema Lungs: clear to auscultation, no accessory muscle use Psych: well oriented with normal range of affect and appropriate judgment/insight  Essential hypertension - Plan: lisinopril (ZESTRIL) 20 MG tablet  Hyperlipidemia, unspecified hyperlipidemia type  Chronic rhinitis - Plan: levocetirizine (XYZAL) 5 MG tablet  Chronic right shoulder pain - Plan: Ambulatory referral to Sports Medicine  Chronic, stable. Cont lisinopril 20 mg/d, Toprol XL 100 mg/d. Counseled on diet and exercise. Chronic, stable. Cont Crestor 40 mg/d. Shoulder pain still bothersome, will refer to sports med for their opinion. Encouraged compliance with stretches.  F/u in 6  mo. The patient voiced understanding and agreement to the plan.  Waller, DO 12/12/22  9:10 AM

## 2022-12-13 ENCOUNTER — Ambulatory Visit: Payer: Self-pay

## 2022-12-13 ENCOUNTER — Ambulatory Visit (HOSPITAL_BASED_OUTPATIENT_CLINIC_OR_DEPARTMENT_OTHER)
Admission: RE | Admit: 2022-12-13 | Discharge: 2022-12-13 | Disposition: A | Discharge status: Home / Self Care | Payer: Medicare Other | Source: Ambulatory Visit | Attending: Family Medicine | Admitting: Family Medicine

## 2022-12-13 ENCOUNTER — Ambulatory Visit (INDEPENDENT_AMBULATORY_CARE_PROVIDER_SITE_OTHER): Payer: Medicare Other | Admitting: Family Medicine

## 2022-12-13 ENCOUNTER — Encounter: Payer: Self-pay | Admitting: Family Medicine

## 2022-12-13 VITALS — BP 144/84 | Ht 62.0 in | Wt 122.0 lb

## 2022-12-13 DIAGNOSIS — M778 Other enthesopathies, not elsewhere classified: Secondary | ICD-10-CM

## 2022-12-13 DIAGNOSIS — M25511 Pain in right shoulder: Secondary | ICD-10-CM | POA: Diagnosis not present

## 2022-12-13 MED ORDER — TRIAMCINOLONE ACETONIDE 40 MG/ML IJ SUSP
40.0000 mg | Freq: Once | INTRAMUSCULAR | Status: AC
  Administered 2022-12-13: 40 mg via INTRA_ARTICULAR

## 2022-12-13 NOTE — Patient Instructions (Signed)
Nice to meet you Please try heat before exercise and ice after  Please try the exercises  We'll call with the results from today   Please send me a message in [place] with any questions or updates.  Please see me back in 4 weeks.   --Dr. Raeford Razor

## 2022-12-13 NOTE — Progress Notes (Signed)
  Kelsey Stanley - 72 y.o. female MRN [id number]  Date of birth: 06/11/1951  SUBJECTIVE:  Including CC & ROS.  No chief complaint on file.   Kelsey Stanley is a 72 y.o. female that is  presenting with acute on chronic right shoulder pain. Having limited range of motion with flexion. No prior similar pain and no surgery. Localized to her shoulder.    Review of Systems See HPI   HISTORY: Past Medical, Surgical, Social, and Family History Reviewed & Updated per EMR.   Pertinent Historical Findings include:  Past Medical History:  Diagnosis Date   CAD (coronary artery disease)    GERD (gastroesophageal reflux disease)    Hyperlipidemia    Hypertension     Past Surgical History:  Procedure Laterality Date   ABDOMINAL HYSTERECTOMY  1993   BREAST BIOPSY Right 1994   CHOLECYSTECTOMY     CHOLECYSTECTOMY, LAPAROSCOPIC  1993     PHYSICAL EXAM:  VS: BP (!) 144/84   Ht 5' 2"$  (1.575 m)   Wt 122 lb (55.3 kg)   BMI 22.31 kg/m  Physical Exam Gen: NAD, alert, cooperative with exam, well-appearing MSK:  Neurovascularly intact     Aspiration/Injection Procedure Note Kelsey Stanley 11-18-1950  Procedure: Injection Indications: right shoulder pain  Procedure Details Consent: Risks of procedure as well as the alternatives and risks of each were explained to the (patient/caregiver).  Consent for procedure obtained. Time Out: Verified patient identification, verified procedure, site/side was marked, verified correct patient position, special equipment/implants available, medications/allergies/relevent history reviewed, required imaging and test results available.  Performed.  The area was cleaned with iodine and alcohol swabs.    The right glenohumeral joint was injected using 4 cc of 1% lidocaine and .4 cc's of 8.4% Sodium bicarbonate on a 21-gauge 2 inch needle.  The syringe was switched to mixture containing 3 cc's of 1% lidocaine, 1 cc's of 40 mg Kenalog and 3 cc's of 0.25%  bupivacaine was injected.  Ultrasound was used. Images were obtained in long views showing the injection.     A sterile dressing was applied.  Patient did tolerate procedure well.   ASSESSMENT & PLAN:   Capsulitis of right shoulder Acutely on chronic in nature. More consistent with capsulitis with limited range of motion.  - counseled on home exercise therapy and supportive care - injection today  - xray  - could consider PT or hydrodilation

## 2022-12-13 NOTE — Addendum Note (Signed)
Addended by: Marva Panda on: 12/13/2022 03:27 PM   Modules accepted: Orders

## 2022-12-13 NOTE — Assessment & Plan Note (Signed)
Acutely on chronic in nature. More consistent with capsulitis with limited range of motion.  - counseled on home exercise therapy and supportive care - injection today  - xray  - could consider PT or hydrodilation

## 2022-12-15 ENCOUNTER — Ambulatory Visit (INDEPENDENT_AMBULATORY_CARE_PROVIDER_SITE_OTHER): Payer: Medicare Other | Admitting: *Deleted

## 2022-12-15 DIAGNOSIS — Z0000 Encounter for general adult medical examination without abnormal findings: Secondary | ICD-10-CM

## 2022-12-15 NOTE — Patient Instructions (Signed)
Kelsey Stanley , Thank you for taking time to come for your Medicare Wellness Visit. I appreciate your ongoing commitment to your health goals. Please review the following plan we discussed and let me know if I can assist you in the future.      This is a list of the screening recommended for you and due dates:  Health Maintenance  Topic Date Due   Medicare Annual Wellness Visit  12/15/2023   Mammogram  04/04/2024   Colon Cancer Screening  06/17/2028   DTaP/Tdap/Td vaccine (4 - Td or Tdap) 10/02/2028   Pneumonia Vaccine  Completed   Flu Shot  Completed   DEXA scan (bone density measurement)  Completed   Hepatitis C Screening: USPSTF Recommendation to screen - Ages 62-79 yo.  Completed   Zoster (Shingles) Vaccine  Completed   HPV Vaccine  Aged Out   COVID-19 Vaccine  Discontinued     Next appointment: Follow up in one year for your annual wellness visit.   Preventive Care 23 Years and Older, Female Preventive care refers to lifestyle choices and visits with your health care provider that can promote health and wellness. What does preventive care include? A yearly physical exam. This is also called an annual well check. Dental exams once or twice a year. Routine eye exams. Ask your health care provider how often you should have your eyes checked. Personal lifestyle choices, including: Daily care of your teeth and gums. Regular physical activity. Eating a healthy diet. Avoiding tobacco and drug use. Limiting alcohol use. Practicing safe sex. Taking low-dose aspirin every day. Taking vitamin and mineral supplements as recommended by your health care provider. What happens during an annual well check? The services and screenings done by your health care provider during your annual well check will depend on your age, overall health, lifestyle risk factors, and family history of disease. Counseling  Your health care provider may ask you questions about your: Alcohol use. Tobacco  use. Drug use. Emotional well-being. Home and relationship well-being. Sexual activity. Eating habits. History of falls. Memory and ability to understand (cognition). Work and work Statistician. Reproductive health. Screening  You may have the following tests or measurements: Height, weight, and BMI. Blood pressure. Lipid and cholesterol levels. These may be checked every 5 years, or more frequently if you are over 84 years old. Skin check. Lung cancer screening. You may have this screening every year starting at age 70 if you have a 30-pack-year history of smoking and currently smoke or have quit within the past 15 years. Fecal occult blood test (FOBT) of the stool. You may have this test every year starting at age 56. Flexible sigmoidoscopy or colonoscopy. You may have a sigmoidoscopy every 5 years or a colonoscopy every 10 years starting at age 57. Hepatitis C blood test. Hepatitis B blood test. Sexually transmitted disease (STD) testing. Diabetes screening. This is done by checking your blood sugar (glucose) after you have not eaten for a while (fasting). You may have this done every 1-3 years. Bone density scan. This is done to screen for osteoporosis. You may have this done starting at age 49. Mammogram. This may be done every 1-2 years. Talk to your health care provider about how often you should have regular mammograms. Talk with your health care provider about your test results, treatment options, and if necessary, the need for more tests. Vaccines  Your health care provider may recommend certain vaccines, such as: Influenza vaccine. This is recommended every year. Tetanus,  diphtheria, and acellular pertussis (Tdap, Td) vaccine. You may need a Td booster every 10 years. Zoster vaccine. You may need this after age 21. Pneumococcal 13-valent conjugate (PCV13) vaccine. One dose is recommended after age 62. Pneumococcal polysaccharide (PPSV23) vaccine. One dose is recommended  after age 79. Talk to your health care provider about which screenings and vaccines you need and how often you need them. This information is not intended to replace advice given to you by your health care provider. Make sure you discuss any questions you have with your health care provider. Document Released: 10/30/2015 Document Revised: 06/22/2016 Document Reviewed: 08/04/2015 Elsevier Interactive Patient Education  2017 [place] Prevention in the Home Falls can cause injuries. They can happen to people of all ages. There are many things you can do to make your home safe and to help prevent falls. What can I do on the outside of my home? Regularly fix the edges of walkways and driveways and fix any cracks. Remove anything that might make you trip as you walk through a door, such as a raised step or threshold. Trim any bushes or trees on the path to your home. Use bright outdoor lighting. Clear any walking paths of anything that might make someone trip, such as rocks or tools. Regularly check to see if handrails are loose or broken. Make sure that both sides of any steps have handrails. Any raised decks and porches should have guardrails on the edges. Have any leaves, snow, or ice cleared regularly. Use sand or salt on walking paths during winter. Clean up any spills in your garage right away. This includes oil or grease spills. What can I do in the bathroom? Use night lights. Install grab bars by the toilet and in the tub and shower. Do not use towel bars as grab bars. Use non-skid mats or decals in the tub or shower. If you need to sit down in the shower, use a plastic, non-slip stool. Keep the floor dry. Clean up any water that spills on the floor as soon as it happens. Remove soap buildup in the tub or shower regularly. Attach bath mats securely with double-sided non-slip rug tape. Do not have throw rugs and other things on the floor that can make you trip. What can I do  in the bedroom? Use night lights. Make sure that you have a light by your bed that is easy to reach. Do not use any sheets or blankets that are too big for your bed. They should not hang down onto the floor. Have a firm chair that has side arms. You can use this for support while you get dressed. Do not have throw rugs and other things on the floor that can make you trip. What can I do in the kitchen? Clean up any spills right away. Avoid walking on wet floors. Keep items that you use a lot in easy-to-reach places. If you need to reach something above you, use a strong step stool that has a grab bar. Keep electrical cords out of the way. Do not use floor polish or wax that makes floors slippery. If you must use wax, use non-skid floor wax. Do not have throw rugs and other things on the floor that can make you trip. What can I do with my stairs? Do not leave any items on the stairs. Make sure that there are handrails on both sides of the stairs and use them. Fix handrails that are broken or loose. Make  sure that handrails are as long as the stairways. Check any carpeting to make sure that it is firmly attached to the stairs. Fix any carpet that is loose or worn. Avoid having throw rugs at the top or bottom of the stairs. If you do have throw rugs, attach them to the floor with carpet tape. Make sure that you have a light switch at the top of the stairs and the bottom of the stairs. If you do not have them, ask someone to add them for you. What else can I do to help prevent falls? Wear shoes that: Do not have high heels. Have rubber bottoms. Are comfortable and fit you well. Are closed at the toe. Do not wear sandals. If you use a stepladder: Make sure that it is fully opened. Do not climb a closed stepladder. Make sure that both sides of the stepladder are locked into place. Ask someone to hold it for you, if possible. Clearly mark and make sure that you can see: Any grab bars or  handrails. First and last steps. Where the edge of each step is. Use tools that help you move around (mobility aids) if they are needed. These include: Canes. Walkers. Scooters. Crutches. Turn on the lights when you go into a dark area. Replace any light bulbs as soon as they burn out. Set up your furniture so you have a clear path. Avoid moving your furniture around. If any of your floors are uneven, fix them. If there are any pets around you, be aware of where they are. Review your medicines with your doctor. Some medicines can make you feel dizzy. This can increase your chance of falling. Ask your doctor what other things that you can do to help prevent falls. This information is not intended to replace advice given to you by your health care provider. Make sure you discuss any questions you have with your health care provider. Document Released: 07/30/2009 Document Revised: 03/10/2016 Document Reviewed: 11/07/2014 Elsevier Interactive Patient Education  2017 [employer].

## 2022-12-15 NOTE — Progress Notes (Signed)
Subjective:   Kelsey Stanley is a 72 y.o. female who presents for Medicare Annual (Subsequent) preventive examination.  I connected with  Kelsey Stanley on 12/15/22 by a audio enabled telemedicine application and verified that I am speaking with the correct person using two identifiers.  Patient Location: Home  Provider Location: Office/Clinic  I discussed the limitations of evaluation and management by telemedicine. The patient expressed understanding and agreed to proceed.   Review of Systems     Cardiac Risk Factors include: advanced age (>28mn, >>29women);dyslipidemia;hypertension     Objective:    There were no vitals filed for this visit. There is no height or weight on file to calculate BMI.     12/15/2022    9:01 AM 11/10/2022    9:53 AM 12/10/2021    9:20 AM 11/10/2021   10:51 AM 12/03/2020    9:05 AM 05/07/2020    7:53 PM 05/07/2020    2:49 PM  Advanced Directives  Does Patient Have a Medical Advance Directive? No No No No No No No  Would patient like information on creating a medical advance directive? No - Patient declined No - Patient declined No - Patient declined  No - Patient declined No - Patient declined No - Patient declined    Current Medications (verified) Outpatient Encounter Medications as of 12/15/2022  Medication Sig   aspirin 81 MG chewable tablet Chew 81 mg by mouth daily.   cholecalciferol (VITAMIN D) 400 units TABS tablet Take 400 Units by mouth daily.    clopidogrel (PLAVIX) 75 MG tablet Take 1 tablet (75 mg total) by mouth daily.   levocetirizine (XYZAL) 5 MG tablet Take 1 tablet (5 mg total) by mouth every evening.   lisinopril (ZESTRIL) 20 MG tablet Take 1 tablet (20 mg total) by mouth daily.   metoprolol succinate (TOPROL-XL) 100 MG 24 hr tablet TAKE 1 TABLET DAILY. TAKE WITH OR IMMEDIATELY FOLLOWING A MEAL.   NIFEdipine (PROCARDIA-XL/NIFEDICAL-XL) 30 MG 24 hr tablet Take 1 tab daily as needed.   nitroGLYCERIN (NITROSTAT) 0.4 MG SL  tablet Place under the tongue.   pantoprazole (PROTONIX) 20 MG tablet TAKE 1 TABLET EVERY DAY   Probiotic Product (PROBIOTIC-10 PO) Take 1 capsule by mouth daily.    rosuvastatin (CRESTOR) 40 MG tablet Take 1 tablet (40 mg total) by mouth daily.   valACYclovir (VALTREX) 1000 MG tablet Take 2 tabs and repeat in 12 hours in event of an outbreak.   [DISCONTINUED] clopidogrel (PLAVIX) 75 MG tablet Take by mouth.   [DISCONTINUED] levocetirizine (XYZAL) 5 MG tablet TAKE 1 TABLET EVERY EVENING   [DISCONTINUED] lisinopril (ZESTRIL) 20 MG tablet Take 1 tablet (20 mg total) by mouth daily.   No facility-administered encounter medications on file as of 12/15/2022.    Allergies (verified) Midazolam, Dilaudid [hydromorphone], Fentanyl, Morphine and related, Amoxicillin, and Erythromycin   History: Past Medical History:  Diagnosis Date   Allergy    CAD (coronary artery disease)    GERD (gastroesophageal reflux disease)    Hyperlipidemia    Hypertension    Past Surgical History:  Procedure Laterality Date   ABDOMINAL HYSTERECTOMY  1993   BREAST BIOPSY Right 1994   CHOLECYSTECTOMY     CHOLECYSTECTOMY, LAPAROSCOPIC  1993   Family History  Problem Relation Age of Onset   Diabetes Mother    Hyperlipidemia Mother    Hypertension Mother    Glaucoma Mother    Cancer Mother        bladder cancer  Heart disease Father    Diabetes Sister    Hypertension Sister    Diabetes Sister    Hypertension Sister    Hyperlipidemia Brother    Hypertension Brother    Social History   Socioeconomic History   Marital status: Married    Spouse name: Not on file   Number of children: 2   Years of education: Not on file   Highest education level: Not on file  Occupational History   Not on file  Tobacco Use   Smoking status: Never   Smokeless tobacco: Never  Substance and Sexual Activity   Alcohol use: Never   Drug use: Never   Sexual activity: Yes  Other Topics Concern   Not on file  Social  History Narrative   Not on file   Social Determinants of Health   Financial Resource Strain: Low Risk  (12/10/2021)   Overall Financial Resource Strain (CARDIA)    Difficulty of Paying Living Expenses: Not hard at all  Food Insecurity: No Food Insecurity (12/15/2022)   Hunger Vital Sign    Worried About Running Out of Food in the Last Year: Never true    [place] in the Last Year: Never true  Transportation Needs: No Transportation Needs (12/15/2022)   PRAPARE - Hydrologist (Medical): No    Lack of Transportation (Non-Medical): No  Physical Activity: Sufficiently Active (12/10/2021)   Exercise Vital Sign    Days of Exercise per Week: 7 days    Minutes of Exercise per Session: 30 min  Stress: No Stress Concern Present (12/10/2021)   [place]    Feeling of Stress : Not at all  Social Connections: Moderately Integrated (12/10/2021)   Social Connection and Isolation Panel [NHANES]    Frequency of Communication with Friends and Family: More than three times a week    Frequency of Social Gatherings with Friends and Family: Once a week    Attends Religious Services: More than 4 times per year    Active Member of [employer] or Organizations: No    Attends Music therapist: Never    Marital Status: Married    Tobacco Counseling Counseling given: Not Answered   Clinical Intake:  Pre-visit preparation completed: Yes  Pain : No/denies pain  Diabetes: No  How often do you need to have someone help you when you read instructions, pamphlets, or other written materials from your doctor or pharmacy?: 1 - Never   Activities of Daily Living    12/15/2022    9:03 AM  In your present state of health, do you have any difficulty performing the following activities:  Hearing? 0  Vision? 0  Difficulty concentrating or making decisions? 0  Walking or climbing stairs? 0  Dressing or  bathing? 0  Doing errands, shopping? 0  Preparing Food and eating ? N  Using the Toilet? N  In the past six months, have you accidently leaked urine? N  Do you have problems with loss of bowel control? N  Managing your Medications? N  Managing your Finances? N  Housekeeping or managing your Housekeeping? N    Patient Care Team: Shelda Pal, DO as PCP - General (Family Medicine) Stanford Breed Denice Bors, MD as PCP - Cardiology (Cardiology)  Indicate any recent Medical Services you may have received from other than [facility] providers in the past year (date may be approximate).     Assessment:  This is a routine wellness examination for [place].  Hearing/Vision screen No results found.  Dietary issues and exercise activities discussed: Current Exercise Habits: The patient does not participate in regular exercise at present (has joined a gym in Feb but hasn't started yet.  just had recent shoulder injection), Exercise limited by: None identified   Goals Addressed   None    Depression Screen    12/15/2022    9:03 AM 12/12/2022    8:43 AM 12/10/2021    9:18 AM 04/05/2021    8:55 AM 12/03/2020    9:06 AM 10/04/2019    8:57 AM 10/02/2018   10:05 AM  PHQ 2/9 Scores  PHQ - 2 Score 0 0 0 0 0 0 0  PHQ- 9 Score  0         Fall Risk    12/15/2022    9:01 AM 12/12/2022    8:43 AM 12/10/2021    9:13 AM 04/05/2021    8:55 AM 12/03/2020    9:06 AM  Fall Risk   Falls in the past year? 0 0 0 0 0  Number falls in past yr: 0 0 0 0 0  Injury with Fall? 0 0 0 0 0  Risk for fall due to : No Fall Risks No Fall Risks No Fall Risks No Fall Risks   Follow up Falls evaluation completed Falls evaluation completed Falls prevention discussed Falls evaluation completed Falls prevention discussed    FALL RISK PREVENTION PERTAINING TO THE HOME:  Any stairs in or around the home? No  Home free of loose throw rugs in walkways, pet beds, electrical cords, etc? Yes  Adequate lighting in your home to  reduce risk of falls? Yes   ASSISTIVE DEVICES UTILIZED TO PREVENT FALLS:  Life alert? No  Use of a cane, walker or w/c? No  Grab bars in the bathroom? No  Shower chair or bench in shower?  Built in seat Elevated toilet seat or a handicapped toilet?  Comfort height  TIMED UP AND GO:  Was the test performed?  No, audio visit .   Cognitive Function:        12/15/2022    9:11 AM  6CIT Screen  What Year? 0 points  What month? 0 points  What time? 0 points  Count back from 20 0 points  Months in reverse 0 points  Repeat phrase 2 points  Total Score 2 points    Immunizations Immunization History  Administered Date(s) Administered   Fluad Quad(high Dose 65+) 07/18/2019, 07/31/2020   Influenza, High Dose Seasonal PF 09/15/2021   Influenza, Quadrivalent, Recombinant, Inj, Pf 08/29/2017   Influenza-Unspecified 08/09/2016, 06/19/2018, 08/29/2022   PFIZER(Purple Top)SARS-COV-2 Vaccination 11/06/2019, 11/27/2019, 08/17/2020   Pneumococcal Conjugate-13 06/19/2018   Pneumococcal Polysaccharide-23 12/03/2020   Td (Adult),5 Lf Tetanus Toxid, Preservative Free 08/09/2016   Tdap 07/31/2006, 10/02/2018   Zoster Recombinat (Shingrix) 12/11/2018, 01/16/2019, 05/06/2019, 06/18/2019   Zoster, Live 07/18/2013    TDAP status: Up to date  Flu Vaccine status: Up to date  Pneumococcal vaccine status: Up to date  Covid-19 vaccine status: Declined, Education has been provided regarding the importance of this vaccine but patient still declined. Advised may receive this vaccine at local pharmacy or Health Dept.or vaccine clinic. Aware to provide a copy of the vaccination record if obtained from local pharmacy or Health Dept. Verbalized acceptance and understanding.  Qualifies for Shingles Vaccine? Yes   Zostavax completed Yes   Shingrix Completed?: Yes  Screening Tests Health  Maintenance  Topic Date Due   Medicare Annual Wellness (AWV)  12/10/2022   MAMMOGRAM  04/04/2024   COLONOSCOPY  (Pts 45-54yr Insurance coverage will need to be confirmed)  06/17/2028   DTaP/Tdap/Td (4 - Td or Tdap) 10/02/2028   Pneumonia Vaccine 72 Years old  Completed   INFLUENZA VACCINE  Completed   DEXA SCAN  Completed   Hepatitis C Screening  Completed   Zoster Vaccines- Shingrix  Completed   HPV VACCINES  Aged Out   COVID-19 Vaccine  Discontinued    Health Maintenance  Health Maintenance Due  Topic Date Due   Medicare Annual Wellness (AWV)  12/10/2022    Colorectal cancer screening: Type of screening: Colonoscopy. Completed 06/17/18. Repeat every 10 years  Mammogram status: Completed 04/04/22. Repeat every year  Bone Density status: Completed 03/29/21. Results reflect: Bone density results: OSTEOPENIA. Repeat every 2 years.  Lung Cancer Screening: (Low Dose CT Chest recommended if Age 755-80years, 30 pack-year currently smoking OR have quit w/in 15years.) does not qualify.   Additional Screening:  Hepatitis C Screening: does qualify; Completed 04/03/19  Vision Screening: Recommended annual ophthalmology exams for early detection of glaucoma and other disorders of the eye. Is the patient up to date with their annual eye exam?  Yes  Who is the provider or what is the name of the office in which the patient attends annual eye exams? Dr. DIdolina PrimerIf pt is not established with a provider, would they like to be referred to a provider to establish care? No .   Dental Screening: Recommended annual dental exams for proper oral hygiene  Community Resource Referral / Chronic Care Management: CRR required this visit?  No   CCM required this visit?  No      Plan:     I have personally reviewed and noted the following in the patient's chart:   Medical and social history Use of alcohol, tobacco or illicit drugs  Current medications and supplements including opioid prescriptions. Patient is not currently taking opioid prescriptions. Functional ability and status Nutritional status Physical  activity Advanced directives List of other physicians Hospitalizations, surgeries, and ER visits in previous 12 months Vitals Screenings to include cognitive, depression, and falls Referrals and appointments  In addition, I have reviewed and discussed with patient certain preventive protocols, quality metrics, and best practice recommendations. A written personalized care plan for preventive services as well as general preventive health recommendations were provided to patient.   Due to this being a telephonic visit, the after visit summary with patients personalized plan was offered to patient via mail or my-chart. Patient would like to access on my-chart.   BBeatris Ship COregon  12/15/2022   Nurse Notes: None

## 2022-12-16 ENCOUNTER — Telehealth: Payer: Self-pay | Admitting: Family Medicine

## 2022-12-16 NOTE — Telephone Encounter (Signed)
Informed of results.   Rosemarie Ax, MD [facility] 12/16/2022, 11:33 AM

## 2023-01-06 ENCOUNTER — Other Ambulatory Visit: Payer: Self-pay | Admitting: Family Medicine

## 2023-01-06 MED ORDER — PANTOPRAZOLE SODIUM 20 MG PO TBEC: 20.0000 mg | DELAYED_RELEASE_TABLET | Freq: Every day | ORAL | 2 refills | Status: DC

## 2023-01-06 MED ORDER — METOPROLOL SUCCINATE ER 100 MG PO TB24: ORAL_TABLET | ORAL | 3 refills | Status: DC

## 2023-01-06 MED ORDER — ROSUVASTATIN CALCIUM 40 MG PO TABS: 40.0000 mg | ORAL_TABLET | Freq: Every day | ORAL | 3 refills | Status: DC

## 2023-01-06 NOTE — Addendum Note (Signed)
Addended by: Sharon Seller B on: 01/06/2023 01:17 PM   Modules accepted: Orders

## 2023-01-09 ENCOUNTER — Ambulatory Visit (INDEPENDENT_AMBULATORY_CARE_PROVIDER_SITE_OTHER): Payer: Medicare Other | Admitting: Family Medicine

## 2023-01-09 VITALS — BP 132/76 | HR 97 | Temp 98.6°F | Ht 62.0 in | Wt 120.2 lb

## 2023-01-09 DIAGNOSIS — J0100 Acute maxillary sinusitis, unspecified: Secondary | ICD-10-CM | POA: Diagnosis not present

## 2023-01-09 LAB — POC COVID19 BINAXNOW: SARS Coronavirus 2 Ag: NEGATIVE

## 2023-01-09 LAB — POCT INFLUENZA A/B
Influenza A, POC: NEGATIVE
Influenza B, POC: NEGATIVE

## 2023-01-09 MED ORDER — PROMETHAZINE-DM 6.25-15 MG/5ML PO SYRP: 5.0000 mL | ORAL_SOLUTION | Freq: Four times a day (QID) | ORAL | 0 refills | Status: DC | PRN

## 2023-01-09 MED ORDER — CEFDINIR 300 MG PO CAPS: 300.0000 mg | ORAL_CAPSULE | Freq: Two times a day (BID) | ORAL | 0 refills | Status: AC

## 2023-01-09 NOTE — Patient Instructions (Signed)
Continue to push fluids, practice good hand hygiene, and cover your mouth if you cough. ? ?If you start having fevers, shaking or shortness of breath, seek immediate care. ? ?OK to take Tylenol 1000 mg (2 extra strength tabs) or 975 mg (3 regular strength tabs) every 6 hours as needed. ? ?Let us know if you need anything. ?

## 2023-01-09 NOTE — Progress Notes (Signed)
Chief Complaint  Patient presents with   Cough    Congestion Runny nose     Kelsey Stanley here for URI complaints.  Duration: 3 days  Associated symptoms: sinus headache, sinus pain, rhinorrhea, itchy watery eyes, ear fullness, sore throat, dental pain, and coughing Denies: sinus congestion, itchy watery eyes, ear pain, ear drainage, wheezing, shortness of breath, myalgia, and fevers Treatment to date: Tylenol Sick contacts: Yes; granddaughter  Past Medical History:  Diagnosis Date   Allergy    CAD (coronary artery disease)    GERD (gastroesophageal reflux disease)    Hyperlipidemia    Hypertension     Objective BP 132/76 (BP Location: Left Arm, Patient Position: Sitting, Cuff Size: Normal)   Pulse 97   Temp 98.6 F (37 C) (Oral)   Ht 5\' 2"  (1.575 m)   Wt 120 lb 4 oz (54.5 kg)   SpO2 97%   BMI 21.99 kg/m  General: Awake, alert, appears stated age HEENT: AT, [place], ears patent b/l and TM's neg, nares patent w/o discharge, pharynx pink and without exudates, MMM Neck: No masses or asymmetry Heart: RRR Lungs: CTAB, no accessory muscle use Psych: Age appropriate judgment and insight, normal mood and affect  Acute maxillary sinusitis, recurrence not specified - Plan: cefdinir (OMNICEF) 300 MG capsule  Continue to push fluids, practice good hand hygiene, cover mouth when coughing. F/u prn. If starting to experience fevers, shaking, or shortness of breath, seek immediate care. Pt voiced understanding and agreement to the plan.  [place], DO 01/09/23 11:42 AM

## 2023-01-09 NOTE — Addendum Note (Signed)
Addended by: Sharon Seller B on: 01/09/2023 11:46 AM   Modules accepted: Orders

## 2023-01-11 ENCOUNTER — Ambulatory Visit: Payer: Medicare Other | Admitting: Family Medicine

## 2023-01-30 ENCOUNTER — Encounter: Payer: Self-pay | Admitting: *Deleted

## 2023-01-31 ENCOUNTER — Ambulatory Visit: Payer: Medicare Other | Admitting: Family Medicine

## 2023-02-08 ENCOUNTER — Encounter: Payer: Self-pay | Admitting: Family Medicine

## 2023-02-08 ENCOUNTER — Ambulatory Visit (INDEPENDENT_AMBULATORY_CARE_PROVIDER_SITE_OTHER): Payer: Medicare Other | Admitting: Family Medicine

## 2023-02-08 VITALS — BP 136/70 | Ht 62.0 in | Wt 120.0 lb

## 2023-02-08 DIAGNOSIS — M778 Other enthesopathies, not elsewhere classified: Secondary | ICD-10-CM | POA: Diagnosis not present

## 2023-02-08 NOTE — Assessment & Plan Note (Signed)
Doing well since the injection.  She has good range of motion and no pain. -Counseled on Home exercise therapy and supportive care. - Could consider physical therapy

## 2023-02-08 NOTE — Progress Notes (Signed)
  Kelsey Stanley - 72 y.o. female MRN [id number]  Date of birth: 1951/06/08  SUBJECTIVE:  Including CC & ROS.  No chief complaint on file.   Kelsey Stanley is a 72 y.o. female that is calling in for her shoulder pain.  She is having significant improvement since the injection.  She reports normal range of motion and no pain today.    Review of Systems See HPI   HISTORY: Past Medical, Surgical, Social, and Family History Reviewed & Updated per EMR.   Pertinent Historical Findings include:  Past Medical History:  Diagnosis Date   Allergy    CAD (coronary artery disease)    GERD (gastroesophageal reflux disease)    Hyperlipidemia    Hypertension     Past Surgical History:  Procedure Laterality Date   ABDOMINAL HYSTERECTOMY  1993   BREAST BIOPSY Right 1994   CHOLECYSTECTOMY     CHOLECYSTECTOMY, LAPAROSCOPIC  1993     PHYSICAL EXAM:  VS: BP 136/70 (BP Location: Left Arm, Patient Position: Sitting)   Ht  (1.575 m)   Wt 120 lb (54.4 kg)   BMI 21.95 kg/m  Physical Exam Gen: NAD, alert, cooperative with exam, well-appearing MSK:  Neurovascularly intact       ASSESSMENT & PLAN:   Capsulitis of right shoulder Doing well since the injection.  She has good range of motion and no pain. -Counseled on Home exercise therapy and supportive care. - Could consider physical therapy

## 2023-03-20 ENCOUNTER — Other Ambulatory Visit: Payer: Self-pay | Admitting: Family Medicine

## 2023-03-20 DIAGNOSIS — E2839 Other primary ovarian failure: Secondary | ICD-10-CM

## 2023-03-20 DIAGNOSIS — Z13820 Encounter for screening for osteoporosis: Secondary | ICD-10-CM

## 2023-04-03 NOTE — Progress Notes (Signed)
HPI: FU CAD. Previously cared for at [facility]. Had NSTEMI 9/23. Cath showed 99 LAD, 50 RI and 80 nondominant Lcx. Pt had PCI of LAD with DES. Echo showed EF 35-40, mild MR and TR. Symptoms prior to stent was a sensation in her upper extremities from the elbow down bilaterally and a burning sensation in her chest with activities. Echo 12/23 showed EF 60-65, mild MR. Since last seen, the patient denies any dyspnea on exertion, orthopnea, PND, pedal edema, palpitations, syncope or chest pain.   Current Outpatient Medications  Medication Sig Dispense Refill   aspirin 81 MG chewable tablet Chew 81 mg by mouth daily.     Cholecalciferol 25 MCG (1000 UT) capsule Take 1,000 Units by mouth daily.     clopidogrel (PLAVIX) 75 MG tablet Take 1 tablet (75 mg total) by mouth daily. 90 tablet 3   levocetirizine (XYZAL) 5 MG tablet Take 1 tablet (5 mg total) by mouth every evening. 90 tablet 3   lisinopril (ZESTRIL) 20 MG tablet Take 1 tablet (20 mg total) by mouth daily. 90 tablet 3   metoprolol succinate (TOPROL-XL) 100 MG 24 hr tablet TAKE 1 TABLET DAILY. 90 tablet 3   nitroGLYCERIN (NITROSTAT) 0.4 MG SL tablet Place under the tongue.     pantoprazole (PROTONIX) 20 MG tablet Take 1 tablet (20 mg total) by mouth daily. 90 tablet 2   Probiotic Product (PROBIOTIC-10 PO) Take 1 capsule by mouth daily.      rosuvastatin (CRESTOR) 40 MG tablet Take 1 tablet (40 mg total) by mouth daily. 90 tablet 3   valACYclovir (VALTREX) 1000 MG tablet Take 2 tabs and repeat in 12 hours in event of an outbreak. 90 tablet 2   NIFEdipine (PROCARDIA-XL/NIFEDICAL-XL) 30 MG 24 hr tablet Take 1 tab daily as needed. 30 tablet 2   promethazine-dextromethorphan (PROMETHAZINE-DM) 6.25-15 MG/5ML syrup Take 5 mLs by mouth 4 (four) times daily as needed for cough. 118 mL 0   No current facility-administered medications for this visit.     Past Medical History:  Diagnosis Date   Allergy    CAD (coronary artery disease)    GERD  (gastroesophageal reflux disease)    Hyperlipidemia    Hypertension     Past Surgical History:  Procedure Laterality Date   ABDOMINAL HYSTERECTOMY  1993   BREAST BIOPSY Right 1994   CHOLECYSTECTOMY     CHOLECYSTECTOMY, LAPAROSCOPIC  1993    Social History   Socioeconomic History   Marital status: Married    Spouse name: Not on file   Number of children: 2   Years of education: Not on file   Highest education level: 12th grade  Occupational History   Not on file  Tobacco Use   Smoking status: Never   Smokeless tobacco: Never  Substance and Sexual Activity   Alcohol use: Never   Drug use: Never   Sexual activity: Yes  Other Topics Concern   Not on file  Social History Narrative   Not on file   Social Determinants of Health   Financial Resource Strain: Low Risk  (01/09/2023)   Overall Financial Resource Strain (CARDIA)    Difficulty of Paying Living Expenses: Not hard at all  Food Insecurity: No Food Insecurity (01/09/2023)   Hunger Vital Sign    Worried About Running Out of Food in the Last Year: Never true    Ran Out of Food in the Last Year: Never true  Transportation Needs: No Transportation Needs (01/09/2023)  PRAPARE - Administrator, Civil Service (Medical): No    Lack of Transportation (Non-Medical): No  Physical Activity: Sufficiently Active (01/09/2023)   Exercise Vital Sign    Days of Exercise per Week: 3 days    Minutes of Exercise per Session: 50 min  Stress: No Stress Concern Present (01/09/2023)   [employer] of Occupational Health - Occupational Stress Questionnaire    Feeling of Stress : Not at all  Social Connections: Socially Integrated (01/09/2023)   Social Connection and Isolation Panel [NHANES]    Frequency of Communication with Friends and Family: More than three times a week    Frequency of Social Gatherings with Friends and Family: Once a week    Attends Religious Services: More than 4 times per year    Active Member of  [employer] or Organizations: Yes    Attends Engineer, structural: More than 4 times per year    Marital Status: Married  Catering manager Violence: Not At Risk (12/15/2022)   Humiliation, Afraid, Rape, and Kick questionnaire    Fear of Current or Ex-Partner: No    Emotionally Abused: No    Physically Abused: No    Sexually Abused: No    Family History  Problem Relation Age of Onset   Diabetes Mother    Hyperlipidemia Mother    Hypertension Mother    Glaucoma Mother    Cancer Mother        bladder cancer   Heart disease Father    Diabetes Sister    Hypertension Sister    Diabetes Sister    Hypertension Sister    Hyperlipidemia Brother    Hypertension Brother     ROS: no fevers or chills, productive cough, hemoptysis, dysphasia, odynophagia, melena, hematochezia, dysuria, hematuria, rash, seizure activity, orthopnea, PND, pedal edema, claudication. Remaining systems are negative.  Physical Exam: Well-developed well-nourished in no acute distress.  Skin is warm and dry.  HEENT is normal.  Neck is supple.  Chest is clear to auscultation with normal expansion.  Cardiovascular exam is regular rate and rhythm.  Abdominal exam nontender or distended. No masses palpated. Extremities show no edema. neuro grossly intact   A/P  1 CAD-continue ASA and statin; DC plavix 9/24.  2 hypertension-BP elevated; however she follows this at home and it is typically controlled.  Will continue present medications.  3 hyperlipidemia-continue statin.  4 ischemic CM-LV function improved on FU echo; continue ACE-I and toprol.  Olga Millers, MD

## 2023-04-06 DIAGNOSIS — M8588 Other specified disorders of bone density and structure, other site: Secondary | ICD-10-CM | POA: Diagnosis not present

## 2023-04-06 DIAGNOSIS — Z1231 Encounter for screening mammogram for malignant neoplasm of breast: Secondary | ICD-10-CM | POA: Diagnosis not present

## 2023-04-06 LAB — HM MAMMOGRAPHY

## 2023-04-06 LAB — HM DEXA SCAN

## 2023-04-07 ENCOUNTER — Encounter: Payer: Self-pay | Admitting: Family Medicine

## 2023-04-12 ENCOUNTER — Ambulatory Visit: Payer: Medicare Other | Attending: Cardiology | Admitting: Cardiology

## 2023-04-12 ENCOUNTER — Encounter: Payer: Self-pay | Admitting: Cardiology

## 2023-04-12 VITALS — BP 148/58 | HR 78 | Ht 62.0 in | Wt 123.0 lb

## 2023-04-12 DIAGNOSIS — I10 Essential (primary) hypertension: Secondary | ICD-10-CM | POA: Diagnosis not present

## 2023-04-12 DIAGNOSIS — E785 Hyperlipidemia, unspecified: Secondary | ICD-10-CM | POA: Insufficient documentation

## 2023-04-12 DIAGNOSIS — I255 Ischemic cardiomyopathy: Secondary | ICD-10-CM | POA: Diagnosis not present

## 2023-04-12 DIAGNOSIS — I2510 Atherosclerotic heart disease of native coronary artery without angina pectoris: Secondary | ICD-10-CM | POA: Insufficient documentation

## 2023-04-12 NOTE — Patient Instructions (Signed)
Medication Instructions:   STOP PLAVIX IN SEPTEMBER  *If you need a refill on your cardiac medications before your next appointment, please call your pharmacy*   Follow-Up: At [facility], you and your health needs are our priority.  As part of our continuing mission to provide you with exceptional heart care, we have created designated Provider Care Teams.  These Care Teams include your primary Cardiologist (physician) and Advanced Practice Providers (APPs -  Physician Assistants and Nurse Practitioners) who all work together to provide you with the care you need, when you need it.  We recommend signing up for the patient portal called "MyChart".  Sign up information is provided on this After Visit Summary.  MyChart is used to connect with patients for Virtual Visits (Telemedicine).  Patients are able to view lab/test results, encounter notes, upcoming appointments, etc.  Non-urgent messages can be sent to your provider as well.   To learn more about what you can do with MyChart, go to [email or web address].    Your next appointment:   12 month(s)  Provider:   Olga Millers, MD

## 2023-05-17 ENCOUNTER — Encounter (INDEPENDENT_AMBULATORY_CARE_PROVIDER_SITE_OTHER): Payer: Self-pay

## 2023-05-17 DIAGNOSIS — H40013 Open angle with borderline findings, low risk, bilateral: Secondary | ICD-10-CM | POA: Diagnosis not present

## 2023-06-12 ENCOUNTER — Ambulatory Visit (INDEPENDENT_AMBULATORY_CARE_PROVIDER_SITE_OTHER): Payer: Medicare Other | Admitting: Family Medicine

## 2023-06-12 ENCOUNTER — Encounter: Payer: Self-pay | Admitting: Family Medicine

## 2023-06-12 VITALS — BP 121/80 | HR 63 | Temp 98.0°F | Ht 62.0 in | Wt 125.1 lb

## 2023-06-12 DIAGNOSIS — I25728 Atherosclerosis of autologous artery coronary artery bypass graft(s) with other forms of angina pectoris: Secondary | ICD-10-CM | POA: Insufficient documentation

## 2023-06-12 DIAGNOSIS — I10 Essential (primary) hypertension: Secondary | ICD-10-CM | POA: Diagnosis not present

## 2023-06-12 DIAGNOSIS — E785 Hyperlipidemia, unspecified: Secondary | ICD-10-CM

## 2023-06-12 DIAGNOSIS — R7303 Prediabetes: Secondary | ICD-10-CM

## 2023-06-12 LAB — COMPREHENSIVE METABOLIC PANEL
ALT: 21 U/L (ref 0–35)
AST: 25 U/L (ref 0–37)
Albumin: 4.1 g/dL (ref 3.5–5.2)
Alkaline Phosphatase: 61 U/L (ref 39–117)
BUN: 28 mg/dL — ABNORMAL HIGH (ref 6–23)
CO2: 27 mEq/L (ref 19–32)
Calcium: 9.8 mg/dL (ref 8.4–10.5)
Chloride: 105 mEq/L (ref 96–112)
Creatinine, Ser: 1.23 mg/dL — ABNORMAL HIGH (ref 0.40–1.20)
GFR: 44.45 mL/min — ABNORMAL LOW (ref >60.00)
Glucose, Bld: 102 mg/dL — ABNORMAL HIGH (ref 70–99)
Potassium: 5.1 mEq/L (ref 3.5–5.1)
Sodium: 140 mEq/L (ref 135–145)
Total Bilirubin: 0.3 mg/dL (ref 0.2–1.2)
Total Protein: 6.7 g/dL (ref 6.0–8.3)

## 2023-06-12 LAB — CBC
HCT: 37.5 % (ref 36.0–46.0)
Hemoglobin: 12.2 g/dL (ref 12.0–15.0)
MCHC: 32.4 g/dL (ref 30.0–36.0)
MCV: 84.7 fl (ref 78.0–100.0)
Platelets: 251 10*3/uL (ref 150.0–400.0)
RBC: 4.42 Mil/uL (ref 3.87–5.11)
RDW: 12.8 % (ref 11.5–15.5)
WBC: 5.2 10*3/uL (ref 4.0–10.5)

## 2023-06-12 LAB — LIPID PANEL
Cholesterol: 135 mg/dL (ref 0–200)
HDL: 48.5 mg/dL (ref >39.00)
LDL Cholesterol: 60 mg/dL (ref 0–99)
NonHDL: 86.49
Total CHOL/HDL Ratio: 3
Triglycerides: 131 mg/dL (ref 0.0–149.0)
VLDL: 26.2 mg/dL (ref 0.0–40.0)

## 2023-06-12 LAB — HEMOGLOBIN A1C: Hgb A1c MFr Bld: 6.2 % (ref 4.6–6.5)

## 2023-06-12 NOTE — Progress Notes (Signed)
Chief Complaint  Patient presents with   Follow-up    6 month     Subjective Kelsey Stanley is a 72 y.o. female who presents for hypertension follow up. She does monitor home blood pressures. Blood pressures ranging from 120's/60's on average. She is compliant with medications- lisinopril 20 mg/d, Toprol XL 100 mg/d. Patient has these side effects of medication: none She is usually adhering to a healthy diet overall. Current exercise: walking, elliptical, strength training No CP or SOB.   Hyperlipidemia Patient presents for dyslipidemia follow up. Currently being treated with Crestor 40 mg/d and compliance with treatment thus far has been good. She denies myalgias. Diet/exercise as above.  The patient is known to have coexisting coronary artery disease. - On Plavix 75 mg/d, ASA- stops Plavix next month.    Past Medical History:  Diagnosis Date   Allergy    CAD (coronary artery disease)    GERD (gastroesophageal reflux disease)    Hyperlipidemia    Hypertension     Exam BP 121/80 (BP Location: Right Arm, Patient Position: Sitting, Cuff Size: Normal)   Pulse 63   Temp 98 F (36.7 C) (Oral)   Ht 5\' 2"  (1.575 m)   Wt 125 lb 2 oz (56.8 kg)   SpO2 95%   BMI 22.89 kg/m  General:  well developed, well nourished, in no apparent distress Heart: RRR, no bruits, no LE edema Lungs: clear to auscultation, no accessory muscle use Psych: well oriented with normal range of affect and appropriate judgment/insight  Essential hypertension - Plan: CBC, Comprehensive metabolic panel, Lipid panel  Hyperlipidemia, unspecified hyperlipidemia type  Prediabetes - Plan: Hemoglobin A1c  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 Cont lisinopril 20 mg/d, Toprol XL 100 mg/d. Counseled on diet and exercise. Chronic, stable. Cont Crestor 40 mg/d.  Ck A1c.  Cont Toprol, Plavix for another month, ASA.  F/u in 6 mo. The patient  voiced understanding and agreement to the plan.  Jilda Roche [place], DO 06/12/23  9:00 AM

## 2023-06-12 NOTE — Patient Instructions (Addendum)
Give us 2-3 business days to get the results of your labs back.   Keep the diet clean and stay active.  I recommend getting the flu shot in mid October. This suggestion would change if the CDC comes out with a different recommendation.   Let us know if you need anything. 

## 2023-07-12 ENCOUNTER — Ambulatory Visit: Payer: Medicare Other | Admitting: Family Medicine

## 2023-07-12 ENCOUNTER — Encounter: Payer: Self-pay | Admitting: Family Medicine

## 2023-07-12 VITALS — BP 131/70 | HR 51 | Temp 98.4°F | Resp 16 | Ht 62.0 in | Wt 125.0 lb

## 2023-07-12 DIAGNOSIS — M25532 Pain in left wrist: Secondary | ICD-10-CM | POA: Diagnosis not present

## 2023-07-12 MED ORDER — CELECOXIB 100 MG PO CAPS
100.0000 mg | ORAL_CAPSULE | Freq: Two times a day (BID) | ORAL | 0 refills | Status: DC
Start: 2023-07-12 — End: 2023-10-02

## 2023-07-12 MED ORDER — CELECOXIB 100 MG PO CAPS: 100.0000 mg | ORAL_CAPSULE | Freq: Two times a day (BID) | ORAL | 0 refills | Status: DC

## 2023-07-12 NOTE — Patient Instructions (Addendum)
Ice/cold pack over area for 10-15 min twice daily.  OK to take Tylenol 1000 mg (2 extra strength tabs) or 975 mg (3 regular strength tabs) every 6 hours as needed.  A thumb spica splint is a alternative to the brace we gave you during aggravating activities and at night.   Let us know if you need anything.  Wrist and Forearm Exercises Do exercises exactly as told by your health care provider and adjust them as directed. It is normal to feel mild stretching, pulling, tightness, or discomfort as you do these exercises, but you should stop right away if you feel sudden pain or your pain gets worse.   RANGE OF MOTION EXERCISES These exercises warm up your muscles and joints and improve the movement and flexibility of your injured wrist and forearm. These exercises also help to relieve pain, numbness, and tingling. These exercises are done using the muscles in your injured wrist and forearm. Exercise A: Wrist Flexion, Active With your fingers relaxed, bend your wrist forward as far as you can. Hold this position for 30 seconds. Repeat 2 times. Complete this exercise 3 times per week. Exercise B: Wrist Extension, Active With your fingers relaxed, bend your wrist backward as far as you can. Hold this position for 30 seconds. Repeat 2 times. Complete this exercise 3 times per week. Exercise C: Supination, Active  Stand or sit with your arms at your sides. Bend your left / right elbow to an "L" shape (90 degrees). Turn your palm upward until you feel a gentle stretch on the inside of your forearm. Hold this position for 30 seconds. Slowly return your palm to the starting position. Repeat 2 times. Complete this exercise 3 times per week. Exercise D: Pronation, Active  Stand or sit with your arms at your sides. Bend your left / right elbow to an "L" shape (90 degrees). Turn your palm downward until you feel a gentle stretch on the top of your forearm. Hold this position for 30 seconds. Slowly  return your palm to the starting position. Repeat 2 times. Complete this exercise once a day.  STRETCHING EXERCISES These exercises warm up your muscles and joints and improve the movement and flexibility of your injured wrist and forearm. These exercises also help to relieve pain, numbness, and tingling. These exercises are done using your healthy wrist and forearm to help stretch the muscles in your injured wrist and forearm. Exercise E: Wrist Flexion, Passive  Extend your left / right arm in front of you, relax your wrist, and point your fingers downward. Gently push on the back of your hand. Stop when you feel a gentle stretch on the top of your forearm. Hold this position for 30 seconds. Repeat 2 times. Complete this exercise 3 times per week. Exercise F: Wrist Extension, Passive  Extend your left / right arm in front of you and turn your palm upward. Gently pull your palm and fingertips back so your fingers point downward. You should feel a gentle stretch on the palm-side of your forearm. Hold this position for 30 seconds. Repeat 2 times. Complete this exercise 3 times per week. Exercise G: Forearm Rotation, Supination, Passive Sit with your left / right elbow bent to an "L" shape (90 degrees) with your forearm resting on a table. Keeping your upper body and shoulder still, use your other hand to rotate your forearm palm-up until you feel a gentle to moderate stretch. Hold this position for 30 seconds. Slowly release the stretch and  return to the starting position. Repeat 2 times. Complete this exercise 3 times per week. Exercise H: Forearm Rotation, Pronation, Passive Sit with your left / right elbow bent to an "L" shape (90 degrees) with your forearm resting on a table. Keeping your upper body and shoulder still, use your other hand to rotate your forearm palm-down until you feel a gentle to moderate stretch. Hold this position for 30 seconds. Slowly release the stretch and return  to the starting position. Repeat 2 times. Complete this exercise 3 times per week.  STRENGTHENING EXERCISES These exercises build strength and endurance in your wrist and forearm. Endurance is the ability to use your muscles for a long time, even after they get tired. Exercise I: Wrist Flexors  Sit with your left / right forearm supported on a table and your hand resting palm-up over the edge of the table. Your elbow should be bent to an "L" shape (about 90 degrees) and be below the level of your shoulder. Hold a 3-5 lb weight in your left / right hand. Or, hold a rubber exercise band or tube in both hands, keeping your hands at the same level and hip distance apart. There should be a slight tension in the exercise band or tube. Slowly curl your hand up toward your forearm. Hold this position for 3 seconds. Slowly lower your hand back to the starting position. Repeat 2 times. Complete this exercise 3 times per week. Exercise J: Wrist Extensors  Sit with your left / right forearm supported on a table and your hand resting palm-down over the edge of the table. Your elbow should be bent to an "L" shape (about 90 degrees) and be below the level of your shoulder. Hold a 3-5 lb weight in your left / right hand. Or, hold a rubber exercise band or tube in both hands, keeping your hands at the same level and hip distance apart. There should be a slight tension in the exercise band or tube. Slowly curl your hand up toward your forearm. Hold this position for 3 seconds. Slowly lower your hand back to the starting position. Repeat 2 times. Complete this exercise 3 times per week. Exercise K: Forearm Rotation, Supination  Sit with your left / right forearm supported on a table and your hand resting palm-down. Your elbow should be at your side, bent to an "L" shape (about 90 degrees), and below the level of your shoulder. Keep your wrist stable and in a neutral position throughout the exercise. Gently hold  a lightweight hammer with your left / right hand. Without moving your elbow or wrist, slowly rotate your palm upward to a thumbs-up position. Hold this position for 3 seconds. Slowly return your forearm to the starting position. Repeat 2 times. Complete this exercise 3 times per week. Exercise L: Forearm Rotation, Pronation  Sit with your left / right forearm supported on a table and your hand resting palm-up. Your elbow should be at your side, bent to an "L" shape (about 90 degrees), and below the level of your shoulder. Keep your wrist stable. Do not allow it to move backward or forward during the exercise. Gently hold a lightweight hammer with your left / right hand. Without moving your elbow or wrist, slowly rotate your palm and hand upward to a thumbs-up position. Hold this position for 3 seconds. Slowly return your forearm to the starting position. Repeat 2 times. Complete this exercise 3 times per week. Exercise M: Grip Strengthening  Hold one  of these items in your left / right hand: play dough, therapy putty, a dense sponge, a stress ball, or a large, rolled sock. Squeeze as hard as you can without increasing pain. Hold this position for 5 seconds. Slowly release your grip. Repeat 2 times. Complete this exercise 3 times per week.  This information is not intended to replace advice given to you by your health care provider. Make sure you discuss any questions you have with your health care provider. Document Released: 08/17/2005 Document Revised: 06/27/2016 Document Reviewed: 06/28/2015 Elsevier Interactive Patient Education  [employer].

## 2023-07-12 NOTE — Progress Notes (Signed)
Musculoskeletal Exam  Patient: Kelsey Stanley DOB: 1951-02-18  DOS: 07/12/2023  SUBJECTIVE:  Chief Complaint:   L wrist pain  Kelsey Stanley is a 72 y.o.  female for evaluation and treatment of L wrist pain.   Onset:  2 weeks ago. Was moving luggage and afterwards started to hurt.  Location: bsae of wirst at thumb side Character:  burning and sharp  Progression of issue:  has slightly improved Associated symptoms: hurts with movements, pain at end of ROM, initial bruising, initial swelling No redness or catching/locking.  Treatment: to date has been a topical magnesium/essential oil, Biofreeze.   Neurovascular symptoms: no  Past Medical History:  Diagnosis Date   Allergy    CAD (coronary artery disease)    GERD (gastroesophageal reflux disease)    Hyperlipidemia    Hypertension     Objective: VITAL SIGNS: BP 131/70 (BP Location: Left Arm, Patient Position: Sitting, Cuff Size: Normal)   Pulse (!) 51   Temp 98.4 F (36.9 C) (Oral)   Resp 16   Ht 5\' 2"  (1.575 m)   Wt 125 lb (56.7 kg)   SpO2 95%   BMI 22.86 kg/m  Constitutional: Well formed, well developed. No acute distress. Thorax & Lungs: No accessory muscle use Musculoskeletal: L wrist.   Normal active range of motion: yes.   Normal passive range of motion: yes Tenderness to palpation: yes, over thumb extensor tendons at the wrist Deformity: no Ecchymosis: no Tests positive: Finkelstein's Tests negative: none Neurologic: Normal sensory function. Psychiatric: Normal mood. Age appropriate judgment and insight. Alert & oriented x 3.    Assessment:  Left wrist pain - Plan: celecoxib (CELEBREX) 100 MG capsule  Plan: She is off of Plavix. Celebrex prn. Wrist brace given (suggested thumb spica but will provide full wrist brace for simplicity). Stretches/exercises, heat, ice, Tylenol. Send message in 1 mo if no better.  F/u prn. The patient voiced understanding and agreement to the plan.   Jilda Roche  [place], DO 07/12/23  12:13 PM

## 2023-08-01 DIAGNOSIS — Z23 Encounter for immunization: Secondary | ICD-10-CM | POA: Diagnosis not present

## 2023-09-11 DIAGNOSIS — H903 Sensorineural hearing loss, bilateral: Secondary | ICD-10-CM | POA: Diagnosis not present

## 2023-09-11 DIAGNOSIS — H6092 Unspecified otitis externa, left ear: Secondary | ICD-10-CM | POA: Diagnosis not present

## 2023-09-11 DIAGNOSIS — H9313 Tinnitus, bilateral: Secondary | ICD-10-CM | POA: Diagnosis not present

## 2023-09-11 DIAGNOSIS — J300 Vasomotor rhinitis: Secondary | ICD-10-CM | POA: Diagnosis not present

## 2023-09-11 DIAGNOSIS — H838X3 Other specified diseases of inner ear, bilateral: Secondary | ICD-10-CM | POA: Diagnosis not present

## 2023-09-29 ENCOUNTER — Other Ambulatory Visit: Payer: Self-pay | Admitting: Family Medicine

## 2023-09-29 DIAGNOSIS — M25532 Pain in left wrist: Secondary | ICD-10-CM

## 2023-10-03 ENCOUNTER — Other Ambulatory Visit: Payer: Self-pay | Admitting: Family Medicine

## 2023-10-30 ENCOUNTER — Other Ambulatory Visit: Payer: Self-pay | Admitting: Family Medicine

## 2023-10-30 DIAGNOSIS — I10 Essential (primary) hypertension: Secondary | ICD-10-CM

## 2023-10-30 DIAGNOSIS — J310 Chronic rhinitis: Secondary | ICD-10-CM

## 2023-11-15 ENCOUNTER — Other Ambulatory Visit: Payer: Self-pay | Admitting: Family Medicine

## 2023-11-15 DIAGNOSIS — J310 Chronic rhinitis: Secondary | ICD-10-CM

## 2023-11-16 ENCOUNTER — Telehealth: Payer: Self-pay | Admitting: Family Medicine

## 2023-11-16 DIAGNOSIS — D225 Melanocytic nevi of trunk: Secondary | ICD-10-CM | POA: Diagnosis not present

## 2023-11-16 DIAGNOSIS — L814 Other melanin hyperpigmentation: Secondary | ICD-10-CM | POA: Diagnosis not present

## 2023-11-16 DIAGNOSIS — Z85828 Personal history of other malignant neoplasm of skin: Secondary | ICD-10-CM | POA: Diagnosis not present

## 2023-11-16 DIAGNOSIS — D2271 Melanocytic nevi of right lower limb, including hip: Secondary | ICD-10-CM | POA: Diagnosis not present

## 2023-11-16 DIAGNOSIS — D2272 Melanocytic nevi of left lower limb, including hip: Secondary | ICD-10-CM | POA: Diagnosis not present

## 2023-11-16 DIAGNOSIS — L821 Other seborrheic keratosis: Secondary | ICD-10-CM | POA: Diagnosis not present

## 2023-11-16 NOTE — Telephone Encounter (Signed)
Copied from CRM [id number]. Topic: Medicare AWV >> Nov 16, 2023 10:05 AM Payton Doughty wrote: Reason for CRM: Called 11/16/2023 to sched AWV - NO VOICEMAIL  Verlee Rossetti; Care Guide Ambulatory Clinical Support [facility] Direct Dial: [phone]

## 2023-11-18 ENCOUNTER — Encounter: Payer: Self-pay | Admitting: Emergency Medicine

## 2023-11-18 ENCOUNTER — Ambulatory Visit
Admission: EM | Admit: 2023-11-18 | Discharge: 2023-11-18 | Disposition: A | Discharge status: Home / Self Care | Payer: Medicare Other | Attending: Nurse Practitioner | Admitting: Nurse Practitioner

## 2023-11-18 DIAGNOSIS — J0100 Acute maxillary sinusitis, unspecified: Secondary | ICD-10-CM | POA: Diagnosis not present

## 2023-11-18 MED ORDER — DOXYCYCLINE HYCLATE 100 MG PO CAPS
100.0000 mg | ORAL_CAPSULE | Freq: Two times a day (BID) | ORAL | 0 refills | Status: AC
Start: 2023-11-18 — End: 2023-11-25

## 2023-11-18 NOTE — ED Triage Notes (Signed)
Patient c/o possible sinus infection, cough x 2 weeks, worse at night.  Nasal drainage and headache.  Patient has taken Tylenol.

## 2023-11-18 NOTE — Discharge Instructions (Addendum)
It sounds like you have a sinus infection.  Take the doxycycline as prescribed to treat it.  In addition, also recommend nasal saline rinses to help loosen the congestion and guaifenesin 600 mg twice daily to help break up the congestion.  Seek care if symptoms do not improve with treatment.

## 2023-11-18 NOTE — ED Provider Notes (Signed)
[facility]    CSN: [id number] Arrival date & time: 11/18/23  1104      History   Chief Complaint Chief Complaint  Patient presents with   Cough    HPI [facility] [place] is a 73 y.o. female.   Patient presents today with 2-week history of congested cough, runny and stuffy nose, sore throat that has not improved, sinus pressure and pain in her cheeks, gum pain, headache, decreased appetite, and fatigue because she is not sleeping well at night.  No fever, bodyaches or chills, shortness of breath or chest pain, ear pain, abdominal pain, nausea/vomiting, and diarrhea.  Has taken Tylenol for symptoms without improvement.  Patient reports good compliance with blood pressure medications.  She acknowledges that blood pressure is higher than normal today.  She reports when she typically checks blood pressure at home, it is 130/80.  No vision changes, blurred or double vision, lightheadedness or dizziness, chest pain, shortness of breath.    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      Home Medications    Prior to  Admission medications   Medication Sig Start Date End Date Taking? Authorizing Provider  aspirin 81 MG chewable tablet Chew 81 mg by mouth daily.   Yes [provider]  doxycycline (VIBRAMYCIN) 100 MG capsule Take 1 capsule (100 mg total) by mouth 2 (two) times daily for 7 days. 11/18/23 11/25/23 Yes Valentino Nose, NP  levocetirizine (XYZAL) 5 MG tablet TAKE 1 TABLET EVERY EVENING 11/15/23  Yes Sharlene Dory, DO  lisinopril (ZESTRIL) 20 MG tablet TAKE 1 TABLET DAILY 10/30/23  Yes Sharlene Dory, DO  metoprolol succinate (TOPROL-XL) 100 MG 24 hr tablet TAKE 1 TABLET DAILY. 01/06/23  Yes Sharlene Dory, DO  pantoprazole (PROTONIX) 20 MG tablet TAKE 1 TABLET DAILY 10/03/23  Yes Sharlene Dory, DO  Probiotic Product (PROBIOTIC-10 PO) Take 1 capsule by mouth daily.    Yes [provider]  rosuvastatin (CRESTOR) 40 MG tablet Take 1 tablet (40 mg total) by mouth daily. 01/06/23  Yes Sharlene Dory, DO  celecoxib (CELEBREX) 100 MG capsule TAKE 1 CAPSULE TWICE A DAY 10/02/23   Wendling, Jilda Roche, DO  nitroGLYCERIN (NITROSTAT) 0.4 MG SL tablet Place under the tongue. 07/12/22   [provider]  valACYclovir (VALTREX) 1000 MG tablet Take 2 tabs and repeat in 12 hours in event of an outbreak. 04/05/21   Sharlene Dory, DO    Family History Family History  Problem Relation Age of Onset   Diabetes Mother    Hyperlipidemia Mother    Hypertension  Mother    Glaucoma Mother    Cancer Mother        bladder cancer   Heart disease Father    Diabetes Sister    Hypertension Sister    Diabetes Sister    Hypertension Sister    Hyperlipidemia Brother    Hypertension Brother     Social History Social History   Tobacco Use   Smoking status: Never   Smokeless tobacco: Never  Vaping Use   Vaping status: Never Used  Substance Use Topics   Alcohol use: Never   Drug use: Never     Allergies   Midazolam, Dilaudid  [hydromorphone], Fentanyl, Morphine and codeine, Amoxicillin, and Erythromycin   Review of Systems Review of Systems Per HPI  Physical Exam Triage Vital Signs ED Triage Vitals  Encounter Vitals Group     BP 11/18/23 1134 (!) 206/78     Systolic BP Percentile --      Diastolic BP Percentile --      Pulse Rate 11/18/23 1134 64     Resp 11/18/23 1134 16     Temp 11/18/23 1134 98.5 F (36.9 C)     Temp Source 11/18/23 1134 Oral     SpO2 11/18/23 1134 98 %     Weight 11/18/23 1136 127 lb (57.6 kg)     Height 11/18/23 1136 5\' 2"  (1.575 m)     Head Circumference --      Peak Flow --      Pain Score 11/18/23 1136 0     Pain Loc --      Pain Education --      Exclude from Growth Chart --    No data found.  Updated Vital Signs BP (!) 206/78 (BP Location: Right Arm)   Pulse 64   Temp 98.5 F (36.9 C) (Oral)   Resp 16   Ht 5\' 2"  (1.575 m)   Wt 127 lb (57.6 kg)   SpO2 98%   BMI 23.23 kg/m   BP recheck: 193/80  Visual Acuity Right Eye Distance:   Left Eye Distance:   Bilateral Distance:    Right Eye Near:   Left Eye Near:    Bilateral Near:     Physical Exam Vitals and nursing note reviewed.  Constitutional:      General: She is not in acute distress.    Appearance: Normal appearance. She is not ill-appearing or toxic-appearing.  HENT:     Head: Normocephalic and atraumatic.     Right Ear: Tympanic membrane, ear canal and external ear normal.     Left Ear: Tympanic membrane, ear canal and external ear normal.     Nose: Congestion and rhinorrhea present.     Right Sinus: Maxillary sinus tenderness present. No frontal sinus tenderness.     Left Sinus: Maxillary sinus tenderness present. No frontal sinus tenderness.     Mouth/Throat:     Mouth: Mucous membranes are moist.     Pharynx: Oropharynx is clear. No oropharyngeal exudate or posterior oropharyngeal erythema.  Eyes:     General: No scleral icterus.    Extraocular Movements: Extraocular movements intact.   Cardiovascular:     Rate and Rhythm: Normal rate and regular rhythm.  Pulmonary:     Effort: Pulmonary effort is normal. No respiratory distress.     Breath sounds: Normal breath sounds. No wheezing, rhonchi or rales.  Musculoskeletal:     Cervical back: Normal range of motion and neck supple.  Lymphadenopathy:  Cervical: No cervical adenopathy.  Skin:    General: Skin is warm and dry.     Capillary Refill: Capillary refill takes less than 2 seconds.     Coloration: Skin is not jaundiced or pale.     Findings: No erythema or rash.  Neurological:     Mental Status: She is alert and oriented to person, place, and time.     Motor: No weakness.  Psychiatric:        Behavior: Behavior is cooperative.      UC Treatments / Results  Labs (all labs ordered are listed, but only abnormal results are displayed) Labs Reviewed - No data to display  EKG   Radiology No results found.  Procedures Procedures (including critical care time)  Medications Ordered in UC Medications - No data to display  Initial Impression / Assessment and Plan / UC Course  I have reviewed the triage vital signs and the nursing notes.  Pertinent labs & imaging results that were available during my care of the patient were reviewed by me and considered in my medical decision making (see chart for details).  Patient is well-appearing, afebrile, not tachycardic, not tachypneic, oxygenating well on room air.  Patient is hypertensive in urgent care today.  Upon recheck, blood pressure improved by 13 points systolically, however she still remained hypertensive.  I recommended she recheck blood pressure at home and seek care immediately if she develop symptoms of high blood pressure or follow-up with PCP if symptoms do not occur but blood pressure remains elevated.  Patient verbalizes understanding.  1. Acute non-recurrent maxillary sinusitis Treat with doxycycline twice daily for 7 days Supportive care  discussed with patient including nasal saline rinses, guaifenesin 600 mg twice daily ER and return precautions also discussed   The patient was given the opportunity to ask questions.  All questions answered to their satisfaction.  The patient is in agreement to this plan.    Final Clinical Impressions(s) / UC Diagnoses   Final diagnoses:  Acute non-recurrent maxillary sinusitis     Discharge Instructions      It sounds like you have a sinus infection.  Take the doxycycline as prescribed to treat it.  In addition, also recommend nasal saline rinses to help loosen the congestion and guaifenesin 600 mg twice daily to help break up the congestion.  Seek care if symptoms do not improve with treatment.    ED Prescriptions     Medication Sig Dispense Auth. Provider   doxycycline (VIBRAMYCIN) 100 MG capsule Take 1 capsule (100 mg total) by mouth 2 (two) times daily for 7 days. 14 capsule Valentino Nose, NP      PDMP not reviewed this encounter.   Valentino Nose, NP 11/18/23 [phone]

## 2023-11-22 ENCOUNTER — Telehealth: Payer: Self-pay

## 2023-11-22 ENCOUNTER — Other Ambulatory Visit: Payer: Self-pay

## 2023-11-22 DIAGNOSIS — I10 Essential (primary) hypertension: Secondary | ICD-10-CM

## 2023-11-22 MED ORDER — LISINOPRIL 20 MG PO TABS
20.0000 mg | ORAL_TABLET | Freq: Every day | ORAL | 3 refills | Status: DC
Start: 2023-11-22 — End: 2024-09-11

## 2023-11-22 NOTE — Telephone Encounter (Signed)
 Copied from CRM [id number]. Topic: Clinical - Prescription Issue >> Nov 22, 2023 12:35 PM Evie B wrote: Reason for CRM: pt called to advise her medication was sent to the wrong mail order pharmacy. Pt is requesting the provider to sent the medication to [facility] delivery mail order . lisinopril  (ZESTRIL ) 20 MG tablet. Pt states the insurance will not cover [facility] scripts. Please call pt back at [phone]

## 2023-11-22 NOTE — Telephone Encounter (Signed)
 Refill sent and spoke with pt.

## 2023-11-24 ENCOUNTER — Ambulatory Visit: Payer: Medicare Other | Admitting: Medical

## 2023-12-07 ENCOUNTER — Ambulatory Visit (INDEPENDENT_AMBULATORY_CARE_PROVIDER_SITE_OTHER): Payer: Medicare Other | Admitting: Medical

## 2023-12-07 VITALS — BP 130/58 | HR 75 | Temp 99.8°F | Resp 16 | Ht 62.0 in | Wt 129.0 lb

## 2023-12-07 DIAGNOSIS — J0100 Acute maxillary sinusitis, unspecified: Secondary | ICD-10-CM | POA: Diagnosis not present

## 2023-12-07 MED ORDER — CEFDINIR 300 MG PO CAPS: 300.0000 mg | ORAL_CAPSULE | Freq: Two times a day (BID) | ORAL | 0 refills | Status: DC

## 2023-12-07 MED ORDER — BENZONATATE 100 MG PO CAPS: 100.0000 mg | ORAL_CAPSULE | Freq: Three times a day (TID) | ORAL | 0 refills | Status: DC | PRN

## 2023-12-07 MED ORDER — AZELASTINE HCL 0.1 % NA SOLN: 2.0000 | Freq: Two times a day (BID) | NASAL | 12 refills | Status: AC

## 2023-12-07 NOTE — Progress Notes (Signed)
 Subjective:    Patient ID: [facility], female    DOB: 1951/08/02, 73 y.o.   MRN: [id number]  HPI  Discussed the use of AI scribe software for clinical note transcription with the patient, who gave verbal consent to proceed.  History of Present Illness   Kelsey [facility] "[facility]" is a 73 year old female with a history of sinus infections who presents with persistent sinus symptoms.  She has been experiencing persistent sinus symptoms for approximately 30 weeks, including continuous nasal congestion, rhinorrhea, and significant pressure on the right maxillary side of her face, described as feeling like it was 'going to bust' last night. She also has body aches, fevers, chills, and sweats, which were particularly severe yesterday, preventing her from getting out of bed. No recent COVID test and no known exposure to flu or COVID. She states does not feel like covid and declines  covid test today. She has covid test at home and explained if get body aches again to recheck and notify me if + result.  Initially, she sought care at an urgent care facility in [place], where she was diagnosed with a maxillary sinus infection and prescribed doxycycline for seven days, which did not alleviate her symptoms. She uses Tylenol for symptom relief and has been using a saline nasal spray and a prescription nasal spray, likely ipratropium, for nasal congestion.  She is allergic to amoxicillin and erythromycin, and doxycycline was ineffective in treating her current sinus infection. She recalls a previous sinus infection for which cefdinir was effective without side effects.  Despite her symptoms, she continues to go to the gym daily. She also reports upper teeth pain, which she associates with her sinus issues.  She frequently cares for her sick grandchild.        Past Medical History:  Diagnosis Date   Allergy    CAD (coronary artery disease)    GERD (gastroesophageal reflux disease)     Hyperlipidemia    Hypertension      Social History   Socioeconomic History   Marital status: Married    Spouse name: Not on file   Number of children: 2   Years of education: Not on file   Highest education level: 12th grade  Occupational History   Not on file  Tobacco Use   Smoking status: Never   Smokeless tobacco: Never  Vaping Use   Vaping status: Never Used  Substance and Sexual Activity   Alcohol use: Never   Drug use: Never   Sexual activity: Yes  Other Topics Concern   Not on file  Social History Narrative   Not on file   Social Drivers of Health   Financial Resource Strain: Low Risk  (12/06/2023)   Overall Financial Resource Strain (CARDIA)    Difficulty of Paying Living Expenses: Not hard at all  Food Insecurity: No Food Insecurity (12/06/2023)   Hunger Vital Sign    Worried About Running Out of Food in the Last Year: Never true    Ran Out of Food in the Last Year: Never true  Transportation Needs: No Transportation Needs (12/06/2023)   PRAPARE - Administrator, Civil Service (Medical): No    Lack of Transportation (Non-Medical): No  Physical Activity: Sufficiently Active (12/06/2023)   Exercise Vital Sign    Days of Exercise per Week: 5 days    Minutes of Exercise per Session: 110 min  Stress: No Stress Concern Present (12/06/2023)   [employer] of Occupational  Health - Occupational Stress Questionnaire    Feeling of Stress : Only a little  Social Connections: Socially Integrated (12/06/2023)   Social Connection and Isolation Panel [NHANES]    Frequency of Communication with Friends and Family: More than three times a week    Frequency of Social Gatherings with Friends and Family: Once a week    Attends Religious Services: More than 4 times per year    Active Member of [employer] or Organizations: Yes    Attends Engineer, structural: More than 4 times per year    Marital Status: Married  Catering manager Violence: Not At Risk  (12/15/2022)   Humiliation, Afraid, Rape, and Kick questionnaire    Fear of Current or Ex-Partner: No    Emotionally Abused: No    Physically Abused: No    Sexually Abused: No    Past Surgical History:  Procedure Laterality Date   ABDOMINAL HYSTERECTOMY  1993   BREAST BIOPSY Right 1994   CHOLECYSTECTOMY     CHOLECYSTECTOMY, LAPAROSCOPIC  1993    Family History  Problem Relation Age of Onset   Diabetes Mother    Hyperlipidemia Mother    Hypertension Mother    Glaucoma Mother    Cancer Mother        bladder cancer   Heart disease Father    Diabetes Sister    Hypertension Sister    Diabetes Sister    Hypertension Sister    Hyperlipidemia Brother    Hypertension Brother     Allergies  Allergen Reactions   Midazolam Nausea And Vomiting   Dilaudid [Hydromorphone] Nausea And Vomiting   Fentanyl Nausea And Vomiting   Morphine And Codeine Other (See Comments)    Unable to communicate   Amoxicillin Rash   Erythromycin Rash    Current Outpatient Medications on File Prior to Visit  Medication Sig Dispense Refill   aspirin 81 MG chewable tablet Chew 81 mg by mouth daily.     celecoxib (CELEBREX) 100 MG capsule TAKE 1 CAPSULE TWICE A DAY 90 capsule 7   levocetirizine (XYZAL) 5 MG tablet TAKE 1 TABLET EVERY EVENING 90 tablet 3   lisinopril (ZESTRIL) 20 MG tablet Take 1 tablet (20 mg total) by mouth daily. 90 tablet 3   metoprolol succinate (TOPROL-XL) 100 MG 24 hr tablet TAKE 1 TABLET DAILY. 90 tablet 3   nitroGLYCERIN (NITROSTAT) 0.4 MG SL tablet Place under the tongue.     pantoprazole (PROTONIX) 20 MG tablet TAKE 1 TABLET DAILY 90 tablet 3   Probiotic Product (PROBIOTIC-10 PO) Take 1 capsule by mouth daily.      rosuvastatin (CRESTOR) 40 MG tablet Take 1 tablet (40 mg total) by mouth daily. 90 tablet 3   valACYclovir (VALTREX) 1000 MG tablet Take 2 tabs and repeat in 12 hours in event of an outbreak. 90 tablet 2   No current facility-administered medications on file prior  to visit.    BP (!) 130/58 (BP Location: Right Arm, Patient Position: Sitting, Cuff Size: Small)   Pulse 75   Temp 99.8 F (37.7 C) (Oral)   Resp 16   Ht 5\' 2"  (1.575 m)   Wt 129 lb (58.5 kg)   SpO2 97%   BMI 23.59 kg/m        Review of Systems  Constitutional:  Negative for chills and fever.  HENT:  Positive for congestion, sinus pressure and sinus pain. Negative for postnasal drip.   Respiratory:  Positive for cough. Negative for chest tightness,  shortness of breath and wheezing.   Cardiovascular:  Negative for chest pain, palpitations and leg swelling.  Gastrointestinal:  Negative for abdominal pain.  Genitourinary:  Negative for dysuria.  Musculoskeletal:  Negative for back pain.  Skin:  Negative for rash.  Neurological:  Negative for dizziness, syncope, weakness, numbness and headaches.  Hematological:  Negative for adenopathy.  Psychiatric/Behavioral:  Negative for behavioral problems and hallucinations. The patient is not nervous/anxious.     Past Medical History:  Diagnosis Date   Allergy    CAD (coronary artery disease)    GERD (gastroesophageal reflux disease)    Hyperlipidemia    Hypertension      Social History   Socioeconomic History   Marital status: Married    Spouse name: Not on file   Number of children: 2   Years of education: Not on file   Highest education level: 12th grade  Occupational History   Not on file  Tobacco Use   Smoking status: Never   Smokeless tobacco: Never  Vaping Use   Vaping status: Never Used  Substance and Sexual Activity   Alcohol use: Never   Drug use: Never   Sexual activity: Yes  Other Topics Concern   Not on file  Social History Narrative   Not on file   Social Drivers of Health   Financial Resource Strain: Low Risk  (12/06/2023)   Overall Financial Resource Strain (CARDIA)    Difficulty of Paying Living Expenses: Not hard at all  Food Insecurity: No Food Insecurity (12/06/2023)   Hunger Vital Sign     Worried About Running Out of Food in the Last Year: Never true    Ran Out of Food in the Last Year: Never true  Transportation Needs: No Transportation Needs (12/06/2023)   PRAPARE - Administrator, Civil Service (Medical): No    Lack of Transportation (Non-Medical): No  Physical Activity: Sufficiently Active (12/06/2023)   Exercise Vital Sign    Days of Exercise per Week: 5 days    Minutes of Exercise per Session: 110 min  Stress: No Stress Concern Present (12/06/2023)   [employer] of Occupational Health - Occupational Stress Questionnaire    Feeling of Stress : Only a little  Social Connections: Socially Integrated (12/06/2023)   Social Connection and Isolation Panel [NHANES]    Frequency of Communication with Friends and Family: More than three times a week    Frequency of Social Gatherings with Friends and Family: Once a week    Attends Religious Services: More than 4 times per year    Active Member of [employer] or Organizations: Yes    Attends Engineer, structural: More than 4 times per year    Marital Status: Married  Catering manager Violence: Not At Risk (12/15/2022)   Humiliation, Afraid, Rape, and Kick questionnaire    Fear of Current or Ex-Partner: No    Emotionally Abused: No    Physically Abused: No    Sexually Abused: No    Past Surgical History:  Procedure Laterality Date   ABDOMINAL HYSTERECTOMY  1993   BREAST BIOPSY Right 1994   CHOLECYSTECTOMY     CHOLECYSTECTOMY, LAPAROSCOPIC  1993    Family History  Problem Relation Age of Onset   Diabetes Mother    Hyperlipidemia Mother    Hypertension Mother    Glaucoma Mother    Cancer Mother        bladder cancer   Heart disease Father    Diabetes Sister  Hypertension Sister    Diabetes Sister    Hypertension Sister    Hyperlipidemia Brother    Hypertension Brother     Allergies  Allergen Reactions   Midazolam Nausea And Vomiting   Dilaudid [Hydromorphone] Nausea And Vomiting    Fentanyl Nausea And Vomiting   Morphine And Codeine Other (See Comments)    Unable to communicate   Amoxicillin Rash   Erythromycin Rash    Current Outpatient Medications on File Prior to Visit  Medication Sig Dispense Refill   aspirin 81 MG chewable tablet Chew 81 mg by mouth daily.     celecoxib (CELEBREX) 100 MG capsule TAKE 1 CAPSULE TWICE A DAY 90 capsule 7   levocetirizine (XYZAL) 5 MG tablet TAKE 1 TABLET EVERY EVENING 90 tablet 3   lisinopril (ZESTRIL) 20 MG tablet Take 1 tablet (20 mg total) by mouth daily. 90 tablet 3   metoprolol succinate (TOPROL-XL) 100 MG 24 hr tablet TAKE 1 TABLET DAILY. 90 tablet 3   nitroGLYCERIN (NITROSTAT) 0.4 MG SL tablet Place under the tongue.     pantoprazole (PROTONIX) 20 MG tablet TAKE 1 TABLET DAILY 90 tablet 3   Probiotic Product (PROBIOTIC-10 PO) Take 1 capsule by mouth daily.      rosuvastatin (CRESTOR) 40 MG tablet Take 1 tablet (40 mg total) by mouth daily. 90 tablet 3   valACYclovir (VALTREX) 1000 MG tablet Take 2 tabs and repeat in 12 hours in event of an outbreak. 90 tablet 2   No current facility-administered medications on file prior to visit.    BP (!) 130/58 (BP Location: Right Arm, Patient Position: Sitting, Cuff Size: Small)   Pulse 75   Temp 99.8 F (37.7 C) (Oral)   Resp 16   Ht 5\' 2"  (1.575 m)   Wt 129 lb (58.5 kg)   SpO2 97%   BMI 23.59 kg/m        Objective:   Physical Exam  General Mental Status- Alert. General Appearance- Not in acute distress.   Skin General: Color- Normal Color. Moisture- Normal Moisture.  Neck Carotid Arteries- Normal color. Moisture- Normal Moisture. No carotid bruits. No JVD.  Chest and Lung Exam Auscultation: Breath Sounds:-Normal.  Cardiovascular Auscultation:Rythm- Regular. Murmurs & Other Heart Sounds:Auscultation of the heart reveals- No Murmurs.  Abdomen Inspection:-Inspeection Normal. Palpation/Percussion:Note:No mass. Palpation and Percussion of the abdomen reveal-  Non Tender, Non Distended + BS, no rebound or guarding.   Neurologic Cranial Nerve exam:- CN III-XII intact(No nystagmus), symmetric smile. Strength:- 5/5 equal and symmetric strength both upper and lower extremities.       Assessment & Plan:   Patient Instructions  Maxillary Sinusitis Persistent symptoms despite a course of doxycycline. Right-sided facial pressure, nasal congestion, and mucous production. No improvement with saline nasal spray. -Start Cefdinir 300mg  twice daily for 10 days. -Start Astelin nasal spray as needed for nasal congestion. -Consider CT of sinuses and referral to ENT if no improvement after this course of antibiotics.  Cough Occasional coughing noted during the visit. -Start Benzonatate as needed for cough.  Follow-up Patient has an appointment with Dr. Carmelia Roller next week. -Plan to reassess symptoms at that time.   Esperanza Richters, PA-C

## 2023-12-07 NOTE — Patient Instructions (Signed)
 Maxillary Sinusitis Persistent symptoms despite a course of doxycycline. Right-sided facial pressure, nasal congestion, and mucous production. No improvement with saline nasal spray. -Start Cefdinir 300mg  twice daily for 10 days. -Start Astelin nasal spray as needed for nasal congestion. -Consider CT of sinuses and referral to ENT if no improvement after this course of antibiotics.  Cough Occasional coughing noted during the visit. -Start Benzonatate as needed for cough.  Follow-up Patient has an appointment with Dr. Carmelia Roller next week. -Plan to reassess symptoms at that time.

## 2023-12-13 ENCOUNTER — Other Ambulatory Visit: Payer: Self-pay | Admitting: Family Medicine

## 2023-12-13 ENCOUNTER — Ambulatory Visit (INDEPENDENT_AMBULATORY_CARE_PROVIDER_SITE_OTHER): Payer: Medicare Other | Admitting: Family Medicine

## 2023-12-13 ENCOUNTER — Encounter: Payer: Self-pay | Admitting: Family Medicine

## 2023-12-13 VITALS — BP 126/82 | HR 80 | Temp 97.6°F | Resp 20 | Ht 62.0 in | Wt 129.2 lb

## 2023-12-13 DIAGNOSIS — E785 Hyperlipidemia, unspecified: Secondary | ICD-10-CM

## 2023-12-13 DIAGNOSIS — M25532 Pain in left wrist: Secondary | ICD-10-CM

## 2023-12-13 DIAGNOSIS — I10 Essential (primary) hypertension: Secondary | ICD-10-CM | POA: Diagnosis not present

## 2023-12-13 DIAGNOSIS — J0100 Acute maxillary sinusitis, unspecified: Secondary | ICD-10-CM | POA: Diagnosis not present

## 2023-12-13 DIAGNOSIS — N1832 Chronic kidney disease, stage 3b: Secondary | ICD-10-CM | POA: Diagnosis not present

## 2023-12-13 MED ORDER — PREDNISONE 20 MG PO TABS
40.0000 mg | ORAL_TABLET | Freq: Every day | ORAL | 0 refills | Status: AC
Start: 2023-12-13 — End: 2023-12-18

## 2023-12-13 MED ORDER — DAPAGLIFLOZIN PROPANEDIOL 10 MG PO TABS: 10.0000 mg | ORAL_TABLET | Freq: Every day | ORAL | 2 refills | Status: DC

## 2023-12-13 MED ORDER — EMPAGLIFLOZIN 10 MG PO TABS: 10.0000 mg | ORAL_TABLET | Freq: Every day | ORAL | 2 refills | Status: DC

## 2023-12-13 NOTE — Patient Instructions (Signed)
 Keep the diet clean and stay active.  Let me know if there are cost issues with the new medicine.  If you do not hear anything about your referral in the next 1-2 weeks, call our office and ask for an update.  Let us know if you need anything.

## 2023-12-13 NOTE — Progress Notes (Signed)
 Chief Complaint  Patient presents with   Hypertension    Patient presents today for hypertension follow-up    Subjective Kelsey Stanley is a 73 y.o. female who presents for hypertension follow up. She does monitor home blood pressures. Blood pressures ranging from 120's/60's on average. She is compliant with medications- lisinopril 20 mg/d, Toprol XL 100 m/d. Patient has these side effects of medication: none She is usually adhering to a healthy diet overall. Current exercise: walking, stair stepper No CP or SOB.   Hyperlipidemia Patient presents for dyslipidemia follow up. Currently being treated with Crestor 40 mg/d and compliance with treatment thus far has been good. She denies myalgias. Diet/exercise as above.  The patient is known to have coexisting coronary artery disease.  Sinus pain Duration: 3 weeks  Associated symptoms: subjective fever, sinus headache, sinus congestion, sinus pain, rhinorrhea, and coughing Denies: itchy watery eyes, ear pain, ear drainage, sore throat, wheezing, shortness of breath, and trauma Treatment to date: Cough drops, Tessalon Perles, Doxy, Omnicef, INCS helped but she started having nosebleeds Sick contacts: No  L wrist pain Has worn a wrist brace and did stretches/exercise as above. Has been going on fo several mo. Not getting better or worse. Intermittent swelling. No decreased ROM, bruising, redness. Celebrex was not helpful.    Past Medical History:  Diagnosis Date   Allergy    CAD (coronary artery disease)    GERD (gastroesophageal reflux disease)    Hyperlipidemia    Hypertension     Exam BP 126/82   Pulse 80   Temp 97.6 F (36.4 C)   Resp 20   Ht 5\' 2"  (1.575 m)   Wt 129 lb 3.2 oz (58.6 kg)   SpO2 97%   BMI 23.63 kg/m  General:  well developed, well nourished, in no apparent distress Heart: RRR, no bruits, no LE edema Lungs: clear to auscultation, no accessory muscle use MSK: No bony ttp or deformity, no edema,  ecchymosis; +TTP over ext poll longus on L. Nml active/passive ROM.  HEENT: Ear canals are patent without otorrhea, TMs negative bilaterally, no sinus TTP bilaterally, nares are patent without rhinorrhea, MMM, no pharyngeal exudate or erythema Psych: well oriented with normal range of affect and appropriate judgment/insight  Essential hypertension  Hyperlipidemia, unspecified hyperlipidemia type  CKD stage 3b, GFR 30-44 ml/min (HCC) - Plan: empagliflozin (JARDIANCE) 10 MG TABS tablet  Acute maxillary sinusitis, recurrence not specified - Plan: predniSONE (DELTASONE) 20 MG tablet  Left wrist pain - Plan: Ambulatory referral to Sports Medicine  Chronic, stable.  Continue lisinopril 20 mg daily, Toprol-XL 100 mg daily.  Counseled on diet and exercise. Chronic, stable.  Continue Crestor 40 mg daily. Chronic, relatively stable.  She is on an ACE inhibitor.  Start Jardiance 10 mg daily.  She will let me know if there are cost issues. She will finish the Omnicef.  Failed doxycycline.  Given her success with Flonase, will send in a 5-day prednisone burst to help with her symptoms.  Recommended she aim the nasal spray towards the outside eye.  This could help with nosebleeds. Suspect tendinitis of the extensor pollicis longus.  Will refer to sports medicine for their opinion. F/u in 6 months for a med check. The patient voiced understanding and agreement to the plan.  Jilda Roche [place], DO 12/13/23  10:46 AM

## 2023-12-14 ENCOUNTER — Ambulatory Visit (INDEPENDENT_AMBULATORY_CARE_PROVIDER_SITE_OTHER): Payer: Medicare Other | Admitting: Sports Medicine

## 2023-12-14 ENCOUNTER — Encounter: Payer: Self-pay | Admitting: Sports Medicine

## 2023-12-14 VITALS — BP 138/88 | Ht 62.0 in | Wt 129.0 lb

## 2023-12-14 DIAGNOSIS — M654 Radial styloid tenosynovitis [de Quervain]: Secondary | ICD-10-CM

## 2023-12-14 NOTE — Progress Notes (Signed)
   Subjective:    Patient ID: Kelsey Stanley, female    DOB: 09-03-1951, 73 y.o.   MRN: [id number]  HPI chief complaint: Left wrist pain  Patient is a very pleasant right-hand-dominant 73 year old female that presents today with several months of radial sided left wrist pain.  Her pain began acutely while picking up luggage.  That was last summer.  Since then she has had intermittent pain.  Occasional swelling as well.  She was prescribed a brace by her primary care physician and instructed to wear it at night.  It has not been helpful and in fact sometimes makes her pain worse.  She has also been using Celebrex as needed which has also not been helpful.  She denies any loss of mobility of the wrist.  No similar problems in the past.   Past medical history reviewed Medications reviewed Allergies reviewed  Review of Systems As above    Objective:   Physical Exam  Well-developed, well-nourished.  No acute distress  Left wrist: Full painless active wrist range of motion.  No obvious swelling.  There is some tenderness to palpation along the first extensor compartment with a positive Finkelstein.  No other tenderness to palpation.  Good pulses.  Limited MSK ultrasound of the left wrist with attention to the first dorsal extensor compartment shows fluid surrounding both the abductor pollicis longus and extensor pollicis brevis tendons.  No tendon tear seen.      Assessment & Plan:   De Quervain's tenosynovitis, left wrist  Patient started a steroid Dosepak today for sinusitis.  That may benefit her tendinitis also.  We will fit her with a thumb spica brace with instructions to wear it continuously during the day for the next week.  She does not need to wear it at night.  Once she has finished her steroid taper she may wean from her brace.  If her pain starts to return, she will resume wearing the brace for 1 additional week and take her Celebrex daily during that time.  If symptoms persist  after that, I would recommend an ultrasound-guided injection to be done in our [place] office.  She will follow-up as needed.  This note was dictated using Dragon naturally speaking software and may contain errors in syntax, spelling, or content which have not been identified prior to signing this note.

## 2023-12-20 ENCOUNTER — Encounter: Payer: Self-pay | Admitting: Family Medicine

## 2023-12-22 ENCOUNTER — Encounter: Payer: Self-pay | Admitting: Pharmacist

## 2023-12-27 ENCOUNTER — Telehealth: Payer: Self-pay | Admitting: Pharmacist

## 2023-12-27 ENCOUNTER — Other Ambulatory Visit: Payer: Self-pay | Admitting: Family Medicine

## 2023-12-27 NOTE — Telephone Encounter (Signed)
 Called patient to discuss. She has [employer] part D plan with [phone] deductible and copay of 34% of [place] cost. Her husband gets [place] thru [place]. Started to help her apply today but she did not have all needed information. Appt set to review tomorrow.

## 2023-12-28 ENCOUNTER — Other Ambulatory Visit: Payer: Self-pay | Admitting: Pharmacist

## 2023-12-28 ENCOUNTER — Encounter: Payer: Self-pay | Admitting: Pharmacist

## 2023-12-28 NOTE — Progress Notes (Signed)
 12/28/2023 Name: Kelsey Stanley MRN: [id number] DOB: 20-Dec-1950  Chief Complaint  Patient presents with   Medication Management    MADALAINE PORTIER is a 73 y.o. year old female who presented for a telephone visit.   They were referred to the pharmacist by their PCP for assistance in managing medication access.    Subjective:  Patient reports high cost of [place]. She would like to appy for medication assistance program with [place]. Her husband also takes [place] and has been getting thru the [place] for the last 2 year (he applies thru his cardiologist's office)   Medication Access/Adherence  Current Pharmacy:  [facility] [place], [place] - [address] [place] [address] Phone: [phone] Fax: [phone]  [facility] [address] [place], [place] - [address] [place] [address] Phone: [phone] Fax: [phone]  [facility] (Now [facility]) - [address] [place], [place] - [address] [place] [address] Phone: [phone] Fax: [phone]  [facility] - [place], [place] - [address] [place] [address] Phone: [phone] Fax: [phone]    Objective:  Lab Results  Component Value Date   HGBA1C 6.2 06/12/2023    Lab Results  Component Value Date   CREATININE 1.23 (H) 06/12/2023   BUN 28 (H) 06/12/2023   NA 140 06/12/2023   K 5.1 06/12/2023   CL 105 06/12/2023   CO2 27 06/12/2023    Lab Results  Component Value Date   CHOL 135 06/12/2023   HDL 48.50 06/12/2023   LDLCALC 60 06/12/2023   TRIG 131.0 06/12/2023   CHOLHDL 3 06/12/2023    Medications Reviewed Today     Reviewed by Henrene Pastor, RPH-CPP (Pharmacist) on 12/28/23 at 1046  Med List Status: <None>   Medication Order Taking? Sig Documenting Provider Last Dose Status Informant  aspirin 81  MG chewable tablet [id number] Yes Chew 81 mg by mouth daily. [provider] Taking Active   azelastine (ASTELIN) 0.1 % nasal spray [id number] No Place 2 sprays into both nostrils 2 (two) times daily. Use in each nostril as directed  Patient not taking: Reported on 12/28/2023   Esperanza Richters, PA-C Not Taking Active   dapagliflozin propanediol (FARXIGA) 10 MG TABS tablet [id number] No Take 1 tablet (10 mg total) by mouth daily.  Patient not taking: Reported on 12/28/2023   Sharlene Dory, DO Not Taking Active   levocetirizine (XYZAL) 5 MG tablet [id number]  TAKE 1 TABLET EVERY EVENING Wendling, Jilda Roche, DO  Active   lisinopril (ZESTRIL) 20 MG tablet [id number] Yes Take 1 tablet (20 mg total) by mouth daily. Sharlene Dory, DO Taking Active   metoprolol succinate (TOPROL-XL) 100 MG 24 hr tablet [id number] Yes TAKE 1 TABLET DAILY Sharlene Dory, DO Taking Active   nitroGLYCERIN (NITROSTAT) 0.4 MG SL tablet [id number]  Place under the tongue. [provider]  Active   pantoprazole (PROTONIX) 20 MG tablet [id number] Yes TAKE 1 TABLET DAILY Sharlene Dory, DO Taking Active   Probiotic Product (PROBIOTIC-10 PO) [id number]  Take 1 capsule by mouth daily.  [provider]  Active Self           Med Note Duffy Rhody, MARTHA A   Thu Dec 03, 2020  9:03 AM)    rosuvastatin (CRESTOR) 40 MG tablet [id number] Yes TAKE 1  TABLET DAILY Sharlene Dory, DO Taking Active   valACYclovir (VALTREX) 1000 MG tablet [id number]  Take 2 tabs and repeat in 12 hours in event of an outbreak. Sharlene Dory, DO  Active               Assessment/Plan:   Medication Access:  - [place]ened for [place]. Patient's reported income is just a little above the cut off which is odd since her husband has qualified. She will double check with her husband on their annual adjusted gross household income.  - Mailed application to patient to complete.  -  Provided 1 time coupon to get 30 days free so patient can start [place] pending review of medication assistance program application.   Follow Up Plan: 2 to 3 weeks.   Henrene Pastor, PharmD Clinical Pharmacist [facility]  Population Health [phone]

## 2024-01-11 ENCOUNTER — Other Ambulatory Visit: Payer: Self-pay | Admitting: Pharmacist

## 2024-01-11 ENCOUNTER — Other Ambulatory Visit: Admitting: Pharmacist

## 2024-01-12 ENCOUNTER — Other Ambulatory Visit: Admitting: Pharmacist

## 2024-01-15 ENCOUNTER — Telehealth: Payer: Self-pay | Admitting: Pharmacist

## 2024-01-15 NOTE — Progress Notes (Signed)
 Reviewed application fro [place] and forwarded to PCP for review. Dr Carmelia Roller returned signed application. Faxed to [place].

## 2024-01-16 ENCOUNTER — Other Ambulatory Visit: Payer: Self-pay | Admitting: Pharmacist

## 2024-01-16 MED ORDER — FARXIGA 10 MG PO TABS: 10.0000 mg | ORAL_TABLET | Freq: Every day | ORAL | 2 refills | Status: DC

## 2024-01-16 NOTE — Progress Notes (Signed)
 01/16/2024 Name: Kelsey Stanley MRN: [id number] DOB: 08-07-1951  No chief complaint on file.   Kelsey Stanley is a 73 y.o. year old female who presented for a telephone visit.   They were referred to the pharmacist by their PCP for assistance in managing medication access.    Subjective:  Patient reports high cost of [place]. Faxed application for [place] program / Farxiga yesterday 01/15/2024.  She did received 30 days free with the coupon I provided 2 weeks ago. She has been taking [place] without any issues. She has noticed that she has been urinating more but it seems to be improving this week. She denies symptom of yeast infection / UTI.   Her husband also takes [place] and has been getting thru the [place] program for the last 2 year (he applies thru his cardiologist's office)   Medication Access/Adherence  Current Pharmacy:  [facility] [place], [place] - [address] [place] [address] Phone: [phone] Fax: [phone]  [facility] [address] [place], [place] - [address] [place] [address] Phone: [phone] Fax: [phone]  [facility] (Now [facility]) - [address] [place], [place] - [address] [place] [address] Phone: [phone] Fax: [phone]  [facility] - [place], [place] - [address] [place] [address] Phone: [phone] Fax: [phone]    Objective:  Lab Results  Component Value Date   HGBA1C 6.2 06/12/2023    Lab Results  Component Value Date   CREATININE 1.23 (H) 06/12/2023   BUN 28 (H) 06/12/2023   NA 140 06/12/2023   K 5.1 06/12/2023   CL 105 06/12/2023   CO2 27 06/12/2023    Lab Results  Component Value Date   CHOL 135 06/12/2023   HDL 48.50 06/12/2023   LDLCALC 60 06/12/2023   TRIG 131.0 06/12/2023   CHOLHDL 3  06/12/2023    Medications Reviewed Today     Reviewed by Henrene Pastor, RPH-CPP (Pharmacist) on 01/16/24 at 1409  Med List Status: <None>   Medication Order Taking? Sig Documenting Provider Last Dose Status Informant  aspirin 81 MG chewable tablet [id number] Yes Chew 81 mg by mouth daily. [provider] Taking Active   azelastine (ASTELIN) 0.1 % nasal spray [id number] No Place 2 sprays into both nostrils 2 (two) times daily. Use in each nostril as directed  Patient not taking: Reported on 12/13/2023   Esperanza Richters, PA-C Not Taking Active   dapagliflozin propanediol (FARXIGA) 10 MG TABS tablet [id number] Yes Take 1 tablet (10 mg total) by mouth daily. Sharlene Dory, DO Taking Active   levocetirizine (XYZAL) 5 MG tablet [id number] Yes TAKE 1 TABLET EVERY EVENING Wendling, Jilda Roche, DO Taking Active   lisinopril (ZESTRIL) 20 MG tablet [id number] Yes Take 1 tablet (20 mg total) by mouth daily. Sharlene Dory, DO Taking Active   metoprolol succinate (TOPROL-XL) 100 MG 24 hr tablet [id number] Yes TAKE 1 TABLET DAILY Sharlene Dory, DO Taking Active   nitroGLYCERIN (NITROSTAT) 0.4 MG SL tablet [id number] Yes Place under the tongue. [provider] Taking Active   pantoprazole (PROTONIX) 20 MG tablet [id number] Yes TAKE 1 TABLET DAILY Sharlene Dory, DO Taking Active   Probiotic Product (PROBIOTIC-10 PO) [id number] Yes Take 1 capsule by mouth daily.  [provider] Taking Active Self  Med Note Zorita Pang Dec 03, 2020  9:03 AM)    rosuvastatin (CRESTOR) 40 MG tablet [id number] Yes TAKE 1 TABLET DAILY Sharlene Dory, DO Taking Active   valACYclovir (VALTREX) 1000 MG tablet [id number]  Take 2 tabs and repeat in 12 hours in event of an outbreak. Sharlene Dory, DO  Active               Assessment/Plan:   Medication Access:  - Verified patient was approved to received Farxiga from [facility] and Me  program thru 10/16/2024. Sent Rx to [facility].  - Patient notified - should receive delivery in about 1 to 2 weeks.   - Reviewed potential side effects and benefits of Farxiga  Follow Up Plan: 6 months to start patient assistance program for 2026.   Henrene Pastor, PharmD Clinical Pharmacist [facility] Primary Care  Population Health [phone]

## 2024-02-05 ENCOUNTER — Other Ambulatory Visit: Payer: Self-pay

## 2024-02-05 ENCOUNTER — Encounter: Payer: Self-pay | Admitting: Family Medicine

## 2024-02-05 MED ORDER — ROSUVASTATIN CALCIUM 40 MG PO TABS: 40.0000 mg | ORAL_TABLET | Freq: Every day | ORAL | 3 refills | Status: AC

## 2024-02-05 MED ORDER — METOPROLOL SUCCINATE ER 100 MG PO TB24: 100.0000 mg | ORAL_TABLET | Freq: Every day | ORAL | 3 refills | Status: AC

## 2024-02-22 ENCOUNTER — Ambulatory Visit

## 2024-02-22 DIAGNOSIS — Z0000 Encounter for general adult medical examination without abnormal findings: Secondary | ICD-10-CM | POA: Diagnosis not present

## 2024-02-22 NOTE — Patient Instructions (Signed)
 Ms. Ruhl , Thank you for taking time to come for your Medicare Wellness Visit. I appreciate your ongoing commitment to your health goals. Please review the following plan we discussed and let me know if I can assist you in the future.   Referrals/Orders/Follow-Ups/Clinician Recommendations: none  This is a list of the screening recommended for you and due dates:  Health Maintenance  Topic Date Due   Flu Shot  05/17/2024   Medicare Annual Wellness Visit  02/21/2025   Mammogram  04/05/2025   Colon Cancer Screening  06/17/2028   DTaP/Tdap/Td vaccine (4 - Td or Tdap) 10/02/2028   Pneumonia Vaccine  Completed   DEXA scan (bone density measurement)  Completed   Hepatitis C Screening  Completed   Zoster (Shingles) Vaccine  Completed   HPV Vaccine  Aged Out   Meningitis B Vaccine  Aged Out   COVID-19 Vaccine  Discontinued    Advanced directives: (ACP Link)Information on Advanced Care Planning can be found at [place]  Secretary of [facility] Advance Health Care Directives Advance Health Care Directives. [email or web address]   Next Medicare Annual Wellness Visit scheduled for next year: Yes  Have you seen your provider in the last 6 months (3 months if uncontrolled diabetes)? No, appointment 06/12/2024  insert Preventive Care attachment Insert FALL PREVENTION attachment if needed

## 2024-02-22 NOTE — Progress Notes (Signed)
 Subjective:   Kelsey Stanley is a 73 y.o. who presents for a Medicare Wellness preventive visit.  Visit Complete: Virtual I connected with  Kelsey Stanley on 02/22/24 by a audio enabled telemedicine application and verified that I am speaking with the correct person using two identifiers.  Patient Location: Home  Provider Location: Home Office  I discussed the limitations of evaluation and management by telemedicine. The patient expressed understanding and agreed to proceed.  Vital Signs: Because this visit was a virtual/telehealth visit, some criteria may be missing or patient reported. Any vitals not documented were not able to be obtained and vitals that have been documented are patient reported.  VideoError- Librarian, academic were attempted between this provider and patient, however failed, due to patient having technical difficulties OR patient did not have access to video capability.  We continued and completed visit with audio only.   Persons Participating in Visit: Patient.  AWV Questionnaire: Yes: Patient Medicare AWV questionnaire was completed by the patient on 02/22/2024; I have confirmed that all information answered by patient is correct and no changes since this date.  Cardiac Risk Factors include: advanced age (>53men, >11 women);dyslipidemia;hypertension     Objective:    Today's Vitals   There is no height or weight on file to calculate BMI.     02/22/2024    8:52 AM 12/15/2022    9:01 AM 11/10/2022    9:53 AM 12/10/2021    9:20 AM 11/10/2021   10:51 AM 12/03/2020    9:05 AM 05/07/2020    7:53 PM  Advanced Directives  Does Patient Have a Medical Advance Directive? No No No No No No No  Would patient like information on creating a medical advance directive? No - Patient declined No - Patient declined No - Patient declined No - Patient declined  No - Patient declined No - Patient declined    Current Medications  (verified) Outpatient Encounter Medications as of 02/22/2024  Medication Sig   aspirin  81 MG chewable tablet Chew 81 mg by mouth daily.   azelastine  (ASTELIN ) 0.1 % nasal spray Place 2 sprays into both nostrils 2 (two) times daily. Use in each nostril as directed   FARXIGA  10 MG TABS tablet Take 1 tablet (10 mg total) by mouth daily.   levocetirizine (XYZAL ) 5 MG tablet TAKE 1 TABLET EVERY EVENING   lisinopril  (ZESTRIL ) 20 MG tablet Take 1 tablet (20 mg total) by mouth daily.   metoprolol  succinate (TOPROL -XL) 100 MG 24 hr tablet Take 1 tablet (100 mg total) by mouth daily.   nitroGLYCERIN  (NITROSTAT ) 0.4 MG SL tablet Place under the tongue.   pantoprazole  (PROTONIX ) 20 MG tablet TAKE 1 TABLET DAILY   Probiotic Product (PROBIOTIC-10 PO) Take 1 capsule by mouth daily.    rosuvastatin  (CRESTOR ) 40 MG tablet Take 1 tablet (40 mg total) by mouth daily.   valACYclovir  (VALTREX ) 1000 MG tablet Take 2 tabs and repeat in 12 hours in event of an outbreak. (Patient not taking: Reported on 02/22/2024)   No facility-administered encounter medications on file as of 02/22/2024.    Allergies (verified) Midazolam, Dilaudid [hydromorphone], Fentanyl, Morphine and codeine, Amoxicillin, and Erythromycin   History: Past Medical History:  Diagnosis Date   Allergy    CAD (coronary artery disease)    GERD (gastroesophageal reflux disease)    Hyperlipidemia    Hypertension    Past Surgical History:  Procedure Laterality Date   ABDOMINAL HYSTERECTOMY  1993   BREAST  BIOPSY Right 1994   CHOLECYSTECTOMY     CHOLECYSTECTOMY, LAPAROSCOPIC  1993   Family History  Problem Relation Age of Onset   Diabetes Mother    Hyperlipidemia Mother    Hypertension Mother    Glaucoma Mother    Cancer Mother        bladder cancer   Heart disease Father    Diabetes Sister    Hypertension Sister    Diabetes Sister    Hypertension Sister    Hyperlipidemia Brother    Hypertension Brother    Social History    Socioeconomic History   Marital status: Married    Spouse name: Not on file   Number of children: 2   Years of education: Not on file   Highest education level: 12th grade  Occupational History   Not on file  Tobacco Use   Smoking status: Never   Smokeless tobacco: Never  Vaping Use   Vaping status: Never Used  Substance and Sexual Activity   Alcohol use: Never   Drug use: Never   Sexual activity: Yes  Other Topics Concern   Not on file  Social History Narrative   Not on file   Social Drivers of Health   Financial Resource Strain: Low Risk  (02/22/2024)   Overall Financial Resource Strain (CARDIA)    Difficulty of Paying Living Expenses: Not hard at all  Food Insecurity: No Food Insecurity (02/22/2024)   Hunger Vital Sign    Worried About Running Out of Food in the Last Year: Never true    Ran Out of Food in the Last Year: Never true  Transportation Needs: No Transportation Needs (02/22/2024)   PRAPARE - Administrator, Civil Service (Medical): No    Lack of Transportation (Non-Medical): No  Physical Activity: Sufficiently Active (02/22/2024)   Exercise Vital Sign    Days of Exercise per Week: 5 days    Minutes of Exercise per Session: 120 min  Stress: No Stress Concern Present (02/22/2024)   [employer] of Occupational Health - Occupational Stress Questionnaire    Feeling of Stress : Not at all  Social Connections: Moderately Integrated (02/22/2024)   Social Connection and Isolation Panel [NHANES]    Frequency of Communication with Friends and Family: More than three times a week    Frequency of Social Gatherings with Friends and Family: Once a week    Attends Religious Services: More than 4 times per year    Active Member of [employer] or Organizations: No    Attends Banker Meetings: Never    Marital Status: Married    Tobacco Counseling Counseling given: Not Answered    Clinical Intake:  Pre-visit preparation completed: Yes  Pain :  No/denies pain     Nutritional Risks: None Diabetes: No  Lab Results  Component Value Date   HGBA1C 6.2 06/12/2023   HGBA1C 6.4 06/10/2022   HGBA1C 6.0 10/04/2021     How often do you need to have someone help you when you read instructions, pamphlets, or other written materials from your doctor or pharmacy?: 1 - Never  Interpreter Needed?: No  Information entered by :: NAllen LPN   Activities of Daily Living     02/22/2024    8:45 AM  In your present state of health, do you have any difficulty performing the following activities:  Hearing? 0  Vision? 0  Difficulty concentrating or making decisions? 0  Walking or climbing stairs? 0  Dressing or bathing? 0  Doing errands, shopping? 0  Preparing Food and eating ? N  Using the Toilet? N  In the past six months, have you accidently leaked urine? N  Do you have problems with loss of bowel control? N  Managing your Medications? N  Managing your Finances? N  Housekeeping or managing your Housekeeping? N    Patient Care Team: Jobe Mulder, DO as PCP - General (Family Medicine) Audery Blazing Deannie Fabian, MD as PCP - Cardiology (Cardiology)  Indicate any recent Medical Services you may have received from other than [facility] providers in the past year (date may be approximate).     Assessment:    This is a routine wellness examination for [place].  Hearing/Vision screen Hearing Screening - Comments:: Denies hearing issues Vision Screening - Comments:: Regular eye exams, Dr. Alto Atta   Goals Addressed             This Visit's Progress    Patient Stated       02/22/2024, stay away from another heart attack, continue to exercise       Depression Screen     02/22/2024    8:53 AM 06/12/2023    8:31 AM 12/15/2022    9:03 AM 12/12/2022    8:43 AM 12/10/2021    9:18 AM 04/05/2021    8:55 AM 12/03/2020    9:06 AM  PHQ 2/9 Scores  PHQ - 2 Score 0 0 0 0 0 0 0  PHQ- 9 Score 0 0  0       Fall Risk     02/22/2024    8:45 AM  06/12/2023    8:31 AM 12/15/2022    9:01 AM 12/12/2022    8:43 AM 12/10/2021    9:13 AM  Fall Risk   Falls in the past year? 0 0 0 0 0  Number falls in past yr: 0 0 0 0 0  Injury with Fall? 0 0 0 0 0  Risk for fall due to : Medication side effect No Fall Risks No Fall Risks No Fall Risks No Fall Risks  Follow up Falls prevention discussed;Falls evaluation completed Falls evaluation completed Falls evaluation completed Falls evaluation completed Falls prevention discussed    MEDICARE RISK AT HOME:  Medicare Risk at Home Any stairs in or around the home?: (Patient-Rptd) No If so, are there any without handrails?: (Patient-Rptd) Yes Home free of loose throw rugs in walkways, pet beds, electrical cords, etc?: (Patient-Rptd) Yes Adequate lighting in your home to reduce risk of falls?: (Patient-Rptd) Yes Life alert?: (Patient-Rptd) No Use of a cane, walker or w/c?: (Patient-Rptd) No Grab bars in the bathroom?: (Patient-Rptd) No Shower chair or bench in shower?: (Patient-Rptd) Yes Elevated toilet seat or a handicapped toilet?: (Patient-Rptd) Yes  TIMED UP AND GO:  Was the test performed?  No  Cognitive Function: 6CIT completed        02/22/2024    8:54 AM 12/15/2022    9:11 AM  6CIT Screen  What Year? 0 points 0 points  What month? 0 points 0 points  What time? 0 points 0 points  Count back from 20 0 points 0 points  Months in reverse 2 points 0 points  Repeat phrase 2 points 2 points  Total Score 4 points 2 points    Immunizations Immunization History  Administered Date(s) Administered   Fluad Quad(high Dose 65+) 07/18/2019, 07/31/2020   Influenza, High Dose Seasonal PF 09/15/2021   Influenza, Quadrivalent, Recombinant, Inj, Pf 08/29/2017   Influenza-Unspecified 08/09/2016,  06/19/2018, 08/29/2022   PFIZER(Purple Top)SARS-COV-2 Vaccination 11/06/2019, 11/27/2019, 08/17/2020   Pneumococcal Conjugate-13 06/19/2018   Pneumococcal Polysaccharide-23 12/03/2020   Td (Adult),5 Lf  Tetanus Toxid, Preservative Free 08/09/2016   Tdap 07/31/2006, 10/02/2018   Zoster Recombinant(Shingrix) 12/11/2018, 01/16/2019, 05/06/2019, 06/18/2019   Zoster, Live 07/18/2013    Screening Tests Health Maintenance  Topic Date Due   INFLUENZA VACCINE  05/17/2024   Medicare Annual Wellness (AWV)  02/21/2025   MAMMOGRAM  04/05/2025   Colonoscopy  06/17/2028   DTaP/Tdap/Td (4 - Td or Tdap) 10/02/2028   Pneumonia Vaccine 26+ Years old  Completed   DEXA SCAN  Completed   Hepatitis C Screening  Completed   Zoster Vaccines- Shingrix  Completed   HPV VACCINES  Aged Out   Meningococcal B Vaccine  Aged Out   COVID-19 Vaccine  Discontinued    Health Maintenance  There are no preventive care reminders to display for this patient.  Health Maintenance Items Addressed: Up to date  Additional Screening:  Vision Screening: Recommended annual ophthalmology exams for early detection of glaucoma and other disorders of the eye.  Dental Screening: Recommended annual dental exams for proper oral hygiene  Community Resource Referral / Chronic Care Management: CRR required this visit?  No   CCM required this visit?  No     Plan:     I have personally reviewed and noted the following in the patient's chart:   Medical and social history Use of alcohol, tobacco or illicit drugs  Current medications and supplements including opioid prescriptions. Patient is not currently taking opioid prescriptions. Functional ability and status Nutritional status Physical activity Advanced directives List of other physicians Hospitalizations, surgeries, and ER visits in previous 12 months Vitals Screenings to include cognitive, depression, and falls Referrals and appointments  In addition, I have reviewed and discussed with patient certain preventive protocols, quality metrics, and best practice recommendations. A written personalized care plan for preventive services as well as general preventive  health recommendations were provided to patient.     Areatha Beecham, LPN   10/22/1094   After Visit Summary: (MyChart) Due to this being a telephonic visit, the after visit summary with patients personalized plan was offered to patient via MyChart   Notes: Nothing significant to report at this time.

## 2024-03-05 ENCOUNTER — Ambulatory Visit (INDEPENDENT_AMBULATORY_CARE_PROVIDER_SITE_OTHER): Admitting: Physician Assistant

## 2024-03-05 ENCOUNTER — Ambulatory Visit: Payer: Self-pay

## 2024-03-05 ENCOUNTER — Encounter: Payer: Self-pay | Admitting: Physician Assistant

## 2024-03-05 VITALS — BP 136/80 | HR 71 | Temp 98.2°F | Ht 62.0 in | Wt 128.0 lb

## 2024-03-05 DIAGNOSIS — W57XXXA Bitten or stung by nonvenomous insect and other nonvenomous arthropods, initial encounter: Secondary | ICD-10-CM

## 2024-03-05 DIAGNOSIS — S40261A Insect bite (nonvenomous) of right shoulder, initial encounter: Secondary | ICD-10-CM

## 2024-03-05 DIAGNOSIS — M7062 Trochanteric bursitis, left hip: Secondary | ICD-10-CM | POA: Diagnosis not present

## 2024-03-05 MED ORDER — TRIAMCINOLONE ACETONIDE 0.1 % EX CREA: 1.0000 | TOPICAL_CREAM | Freq: Two times a day (BID) | CUTANEOUS | 0 refills | Status: DC

## 2024-03-05 MED ORDER — CELECOXIB 200 MG PO CAPS
200.0000 mg | ORAL_CAPSULE | ORAL | 0 refills | Status: DC | PRN
Start: 2024-03-05 — End: 2024-03-12

## 2024-03-05 MED ORDER — DOXYCYCLINE HYCLATE 100 MG PO TABS: 100.0000 mg | ORAL_TABLET | Freq: Two times a day (BID) | ORAL | 0 refills | Status: AC

## 2024-03-05 NOTE — Telephone Encounter (Signed)
 Chief Complaint: tick bite Symptoms: spreading redness, itching, heat Frequency: tick bite 10 days ago, worsened today Pertinent Negatives: Patient denies fever, neck stiffness, muscle aches, H/A Disposition: [] ED /[] Urgent Care (no appt availability in office) / [x] Appointment(In office/virtual)/ []  [facility]/ [] Home Care/ [] Refused Recommended Disposition /[] [facility]/ []  Follow-up with PCP Additional Notes: Pt states she removed a tiny tick off her R shoulder 1 wk ago. Pt states after she removed it with alcohol and tweezers there was a small red bump where it had bitten her. Now, the red area is larger, hot to the touch, and very itchy. Pt denies H/A, chills, fever, neck stiffness, body aches, vomiting. No other symptoms. RN scheduled pt for today at 1420. RN advised the pt if she worsens before her appt and develops a fever, spreading rash, neck stiffness, or a severe headache she should go to the ED. Pt verbalized understanding.    Copied from CRM [id number]. Topic: Clinical - Red Word Triage >> Mar 05, 2024 12:38 PM [place] J wrote: [facility] that prompted transfer to Nurse Triage: Patient is calling in due to a tick bite on her right shoulder. The bite left a small red circle but now it's bigger in size and is also warm and itchy. Reason for Disposition  [1] Red or very tender (to touch) area AND [2] started over 24 hours after the bite  Answer Assessment - Initial Assessment Questions 1. ATTACHED:  "Is the tick still on the skin?"  (e.g., yes, no, unsure)     "It was so small, it was the smallest tick I have ever seen, probably a week ago" 2. ONSET - TICK STILL ATTACHED:  "How long do you think the tick has been on your skin?" (e.g., hours, days, unsure)  Note:  Is there a recent activity (camping, hiking) where the caller may have been exposed?     No  3. ONSET - TICK NOT STILL ATTACHED: "If the tick has been removed, how long do you think the tick was attached  before you removed it?" (e.g., 5 hours, 2 days). "When was this?"     Has been removed - red, raised spot that has grown, is now warm and itchy; "normally I would feel something crawling on me, I was on my deck and I reached up and it had already attached itself", probably was not there very long, she used alcohol and tweezers to remove it 4. LOCATION: "Where is the tick bite located?" (e.g., arm, leg)     R shoulder 5. TYPE of TICK: "Is it a wood tick or a deer tick?" (e.g., deer tick, wood tick; unsure)     Very small, likely a deer tick 6. SIZE of TICK: "How big is the tick?" (e.g., size of poppy seed, apple seed, watermelon seed; unsure) Note: Deer ticks can be the size of a poppy seed (nymph) or an apple seed (adult).       Tiny  7. ENGORGED: "Did the tick look flat or engorged (full, swollen)?" (e.g., flat, engorged; unsure)     "Big as a pin head" 8. OTHER SYMPTOMS: "Do you have any other symptoms?" (e.g., fever, rash, redness at bite area, red ring around bite) Redness at the area that has grown and is warm and itching. "Pin hole in it when I took it off of me and it was raised up just a little bit", pin hole bite still visible. Denies red ring around the bite. Red area  is 2-3 inches around. Denies H/A, fever, neck stiffness, muscle aches  Protocols used: Tick Bite-A-AH

## 2024-03-05 NOTE — Progress Notes (Signed)
 Established patient visit   Patient: Kelsey Stanley   DOB: 27-May-1951   73 y.o. Female  MRN: [id number] Visit Date: 03/05/2024  Today's healthcare provider: Trenton Frock, PA-C   Chief Complaint  Patient presents with   Tick Removal    R shoulder; found a tick on me a few days ago; spot getting bigger, redder and itching; only put alcohol and anti-itch Aveeno cream this morning   Medication Refill    Pt would like refill on CELEBREX    Subjective     Pt found a tick on the front of her right shoulder last week, unsure exactly what day, and since she removed it, the spot has been itchy, erythematous.   She also reports needing a refill on her celebrex , she takes prn from left hip bursitis.   Medications: Outpatient Medications Prior to Visit  Medication Sig   aspirin  81 MG chewable tablet Chew 81 mg by mouth daily.   azelastine  (ASTELIN ) 0.1 % nasal spray Place 2 sprays into both nostrils 2 (two) times daily. Use in each nostril as directed   clotrimazole-betamethasone (LOTRISONE) cream Apply 1 Application topically as needed.   Ergocalciferol 10 MCG (400 UNIT) TABS Take 400 Units by mouth daily.   FARXIGA  10 MG TABS tablet Take 1 tablet (10 mg total) by mouth daily.   levocetirizine (XYZAL ) 5 MG tablet TAKE 1 TABLET EVERY EVENING   lisinopril  (ZESTRIL ) 20 MG tablet Take 1 tablet (20 mg total) by mouth daily.   metoprolol  succinate (TOPROL -XL) 100 MG 24 hr tablet Take 1 tablet (100 mg total) by mouth daily.   nitroGLYCERIN  (NITROSTAT ) 0.4 MG SL tablet Place under the tongue.   pantoprazole  (PROTONIX ) 20 MG tablet TAKE 1 TABLET DAILY   pravastatin  (PRAVACHOL ) 40 MG tablet Take 40 mg by mouth daily.   Probiotic Product (PROBIOTIC-10 PO) Take 1 capsule by mouth daily.    rosuvastatin  (CRESTOR ) 40 MG tablet Take 1 tablet (40 mg total) by mouth daily.   valACYclovir  (VALTREX ) 1000 MG tablet Take 2 tabs and repeat in 12 hours in event of an outbreak.   [DISCONTINUED]  celecoxib  (CELEBREX ) 200 MG capsule Take 200 mg by mouth as needed. (Patient not taking: Reported on 03/05/2024)   No facility-administered medications prior to visit.    Review of Systems  Constitutional:  Negative for fatigue and fever.  Respiratory:  Negative for cough and shortness of breath.   Cardiovascular:  Negative for chest pain and leg swelling.  Gastrointestinal:  Negative for abdominal pain.  Neurological:  Negative for dizziness and headaches.       Objective    BP 136/80   Pulse 71   Temp 98.2 F (36.8 C)   Ht 5\' 2"  (1.575 m)   Wt 128 lb (58.1 kg)   SpO2 99%   BMI 23.41 kg/m    Physical Exam Vitals reviewed.  Constitutional:      Appearance: She is not ill-appearing.  HENT:     Head: Normocephalic.  Eyes:     Conjunctiva/sclera: Conjunctivae normal.  Cardiovascular:     Rate and Rhythm: Normal rate.  Pulmonary:     Effort: Pulmonary effort is normal. No respiratory distress.  Skin:    Comments: Right ant shoulder w/ raised bug bite and surrounding erythema. Mildly warm to touch. No central clearance to rash  Neurological:     Mental Status: She is alert and oriented to person, place, and time.  Psychiatric:  Mood and Affect: Mood normal.        Behavior: Behavior normal.      No results found for any visits on 03/05/24.  Assessment & Plan    Tick bite of right shoulder, initial encounter -     Doxycycline  Hyclate; Take 1 tablet (100 mg total) by mouth 2 (two) times daily for 10 days.  Dispense: 20 tablet; Refill: 0 -     Triamcinolone  Acetonide; Apply 1 Application topically 2 (two) times daily.  Dispense: 30 g; Refill: 0  Greater trochanteric bursitis of left hip -     Celecoxib ; Take 1 capsule (200 mg total) by mouth as needed.  Dispense: 60 capsule; Refill: 0  Rx doxy for tick bite, bid x 10 days, and topical triamcinolone  for itch. Refilling prn celebrex .  Return if symptoms worsen or fail to improve.       Trenton Frock, PA-C   [facility] [phone] (phone) [phone] (fax)  [facility]

## 2024-03-06 ENCOUNTER — Encounter: Payer: Self-pay | Admitting: Physician Assistant

## 2024-03-12 ENCOUNTER — Other Ambulatory Visit: Payer: Self-pay | Admitting: Physician Assistant

## 2024-03-12 DIAGNOSIS — M7062 Trochanteric bursitis, left hip: Secondary | ICD-10-CM

## 2024-03-12 MED ORDER — CELECOXIB 200 MG PO CAPS: 200.0000 mg | ORAL_CAPSULE | ORAL | 0 refills | Status: DC | PRN

## 2024-03-20 ENCOUNTER — Other Ambulatory Visit: Payer: Self-pay | Admitting: Physician Assistant

## 2024-03-20 ENCOUNTER — Telehealth: Payer: Self-pay

## 2024-03-20 DIAGNOSIS — M7062 Trochanteric bursitis, left hip: Secondary | ICD-10-CM

## 2024-03-20 MED ORDER — CELECOXIB 200 MG PO CAPS: 200.0000 mg | ORAL_CAPSULE | Freq: Two times a day (BID) | ORAL | 0 refills | Status: DC | PRN

## 2024-03-20 NOTE — Telephone Encounter (Signed)
 Resent

## 2024-03-20 NOTE — Telephone Encounter (Signed)
 Copied from CRM [id number]. Topic: General - Other >> Mar 20, 2024 12:31 PM Dorisann Garre T wrote: Reason for CRM: Edris Gowers Is calling in needing the dosage for patient medication celecoxib  (CELEBREX ) 200 MG capsule [[id number]] good call back number is [phone]

## 2024-04-09 NOTE — Progress Notes (Signed)
 HPI: FU CAD. Previously cared for at [facility]. Had NSTEMI 9/23. Cath showed 99 LAD, 50 RI and 80 nondominant Lcx. Pt had PCI of LAD with DES. Echo showed EF 35-40, mild MR and TR. Symptoms prior to stent was a sensation in her upper extremities from the elbow down bilaterally and a burning sensation in her chest with activities. Echo 12/23 showed EF 60-65, mild MR. Since last seen, the patient denies any dyspnea on exertion, orthopnea, PND, pedal edema, palpitations, syncope or chest pain.   Current Outpatient Medications  Medication Sig Dispense Refill   aspirin  81 MG chewable tablet Chew 81 mg by mouth daily.     azelastine  (ASTELIN ) 0.1 % nasal spray Place 2 sprays into both nostrils 2 (two) times daily. Use in each nostril as directed 30 mL 12   Calcium  Carbonate-Vit D-Min (CALCIUM  1200 PO) Take 1,200 mg by mouth daily.     celecoxib  (CELEBREX ) 200 MG capsule Take 1 capsule (200 mg total) by mouth 2 (two) times daily as needed. 60 capsule 0   clotrimazole-betamethasone (LOTRISONE) cream Apply 1 Application topically as needed.     Ergocalciferol 10 MCG (400 UNIT) TABS Take 1,000 Units by mouth daily.     FARXIGA  10 MG TABS tablet Take 1 tablet (10 mg total) by mouth daily. 90 tablet 2   levocetirizine (XYZAL ) 5 MG tablet TAKE 1 TABLET EVERY EVENING 90 tablet 3   lisinopril  (ZESTRIL ) 20 MG tablet Take 1 tablet (20 mg total) by mouth daily. 90 tablet 3   metoprolol  succinate (TOPROL -XL) 100 MG 24 hr tablet Take 1 tablet (100 mg total) by mouth daily. 90 tablet 3   nitroGLYCERIN  (NITROSTAT ) 0.4 MG SL tablet Place under the tongue.     pantoprazole  (PROTONIX ) 20 MG tablet TAKE 1 TABLET DAILY 90 tablet 3   Probiotic Product (PROBIOTIC-10 PO) Take 1 capsule by mouth daily.      rosuvastatin  (CRESTOR ) 40 MG tablet Take 1 tablet (40 mg total) by mouth daily. 90 tablet 3   triamcinolone  cream (KENALOG ) 0.1 % Apply 1 Application topically 2 (two) times daily. 30 g 0   valACYclovir  (VALTREX ) 1000  MG tablet Take 2 tabs and repeat in 12 hours in event of an outbreak. 90 tablet 2   No current facility-administered medications for this visit.     Past Medical History:  Diagnosis Date   Allergy    CAD (coronary artery disease)    GERD (gastroesophageal reflux disease)    Hyperlipidemia    Hypertension     Past Surgical History:  Procedure Laterality Date   ABDOMINAL HYSTERECTOMY  1993   BREAST BIOPSY Right 1994   CHOLECYSTECTOMY     CHOLECYSTECTOMY, LAPAROSCOPIC  1993    Social History   Socioeconomic History   Marital status: Married    Spouse name: Not on file   Number of children: 2   Years of education: Not on file   Highest education level: 12th grade  Occupational History   Not on file  Tobacco Use   Smoking status: Never   Smokeless tobacco: Never  Vaping Use   Vaping status: Never Used  Substance and Sexual Activity   Alcohol use: Never   Drug use: Never   Sexual activity: Yes  Other Topics Concern   Not on file  Social History Narrative   Not on file   Social Drivers of Health   Financial Resource Strain: Low Risk  (02/22/2024)   Overall Physicist, medical Strain (  CARDIA)    Difficulty of Paying Living Expenses: Not hard at all  Food Insecurity: No Food Insecurity (02/22/2024)   Hunger Vital Sign    Worried About Running Out of Food in the Last Year: Never true    Ran Out of Food in the Last Year: Never true  Transportation Needs: No Transportation Needs (02/22/2024)   PRAPARE - Administrator, Civil Service (Medical): No    Lack of Transportation (Non-Medical): No  Physical Activity: Sufficiently Active (02/22/2024)   Exercise Vital Sign    Days of Exercise per Week: 5 days    Minutes of Exercise per Session: 120 min  Stress: No Stress Concern Present (02/22/2024)   [employer] of Occupational Health - Occupational Stress Questionnaire    Feeling of Stress : Not at all  Social Connections: Moderately Integrated (02/22/2024)    Social Connection and Isolation Panel    Frequency of Communication with Friends and Family: More than three times a week    Frequency of Social Gatherings with Friends and Family: Once a week    Attends Religious Services: More than 4 times per year    Active Member of [employer] or Organizations: No    Attends Banker Meetings: Never    Marital Status: Married  Catering manager Violence: Not At Risk (02/22/2024)   Humiliation, Afraid, Rape, and Kick questionnaire    Fear of Current or Ex-Partner: No    Emotionally Abused: No    Physically Abused: No    Sexually Abused: No    Family History  Problem Relation Age of Onset   Diabetes Mother    Hyperlipidemia Mother    Hypertension Mother    Glaucoma Mother    Cancer Mother        bladder cancer   Heart disease Father    Diabetes Sister    Hypertension Sister    Diabetes Sister    Hypertension Sister    Hyperlipidemia Brother    Hypertension Brother     ROS: no fevers or chills, productive cough, hemoptysis, dysphasia, odynophagia, melena, hematochezia, dysuria, hematuria, rash, seizure activity, orthopnea, PND, pedal edema, claudication. Remaining systems are negative.  Physical Exam: Well-developed well-nourished in no acute distress.  Skin is warm and dry.  HEENT is normal.  Neck is supple.  Chest is clear to auscultation with normal expansion.  Cardiovascular exam is regular rate and rhythm.  Abdominal exam nontender or distended. No masses palpated. Extremities show no edema. neuro grossly intact  EKG Interpretation Date/Time:  Monday April 15 2024 15:52:36 EDT Ventricular Rate:  60 PR Interval:  158 QRS Duration:  88 QT Interval:  390 QTC Calculation: 390 R Axis:   77  Text Interpretation: Normal sinus rhythm Confirmed by Pietro Rogue ([id number]) on 04/15/2024 4:10:33 PM    A/P  1 coronary artery disease-continue aspirin  and statin.  2 hyperlipidemia-continue statin.    3 hypertension-patient's  blood pressure is controlled.  Continue present medical regimen.    4 ischemic cardiomyopathy-LV function has improved on most recent echocardiogram.  Will continue present medications including ACE inhibitor and beta-blocker.  Rogue Pietro, MD

## 2024-04-11 DIAGNOSIS — Z1231 Encounter for screening mammogram for malignant neoplasm of breast: Secondary | ICD-10-CM | POA: Diagnosis not present

## 2024-04-11 LAB — HM MAMMOGRAPHY

## 2024-04-12 ENCOUNTER — Encounter: Payer: Self-pay | Admitting: Family Medicine

## 2024-04-15 ENCOUNTER — Ambulatory Visit (INDEPENDENT_AMBULATORY_CARE_PROVIDER_SITE_OTHER): Admitting: Cardiology

## 2024-04-15 ENCOUNTER — Encounter: Payer: Self-pay | Admitting: Cardiology

## 2024-04-15 VITALS — BP 174/71 | HR 60 | Ht <= 58 in | Wt 129.0 lb

## 2024-04-15 DIAGNOSIS — I255 Ischemic cardiomyopathy: Secondary | ICD-10-CM | POA: Diagnosis not present

## 2024-04-15 DIAGNOSIS — I2510 Atherosclerotic heart disease of native coronary artery without angina pectoris: Secondary | ICD-10-CM

## 2024-04-15 DIAGNOSIS — E785 Hyperlipidemia, unspecified: Secondary | ICD-10-CM

## 2024-04-15 DIAGNOSIS — I10 Essential (primary) hypertension: Secondary | ICD-10-CM | POA: Diagnosis not present

## 2024-04-15 NOTE — Patient Instructions (Signed)

## 2024-05-17 ENCOUNTER — Other Ambulatory Visit: Payer: Self-pay

## 2024-05-17 ENCOUNTER — Encounter: Payer: Self-pay | Admitting: Family Medicine

## 2024-05-17 DIAGNOSIS — M7062 Trochanteric bursitis, left hip: Secondary | ICD-10-CM

## 2024-05-17 MED ORDER — CELECOXIB 200 MG PO CAPS: 200.0000 mg | ORAL_CAPSULE | Freq: Two times a day (BID) | ORAL | 0 refills | Status: DC | PRN

## 2024-05-17 MED ORDER — CELECOXIB 200 MG PO CAPS: 200.0000 mg | ORAL_CAPSULE | Freq: Two times a day (BID) | ORAL | 0 refills | Status: AC | PRN

## 2024-05-21 DIAGNOSIS — H40013 Open angle with borderline findings, low risk, bilateral: Secondary | ICD-10-CM | POA: Diagnosis not present

## 2024-06-12 ENCOUNTER — Ambulatory Visit (INDEPENDENT_AMBULATORY_CARE_PROVIDER_SITE_OTHER): Payer: Medicare Other | Admitting: Family Medicine

## 2024-06-12 ENCOUNTER — Ambulatory Visit: Payer: Self-pay | Admitting: Family Medicine

## 2024-06-12 ENCOUNTER — Other Ambulatory Visit: Payer: Self-pay

## 2024-06-12 ENCOUNTER — Encounter: Payer: Self-pay | Admitting: Family Medicine

## 2024-06-12 VITALS — BP 122/74 | HR 72 | Temp 98.0°F | Resp 16 | Ht 62.0 in | Wt 129.0 lb

## 2024-06-12 DIAGNOSIS — R7303 Prediabetes: Secondary | ICD-10-CM

## 2024-06-12 DIAGNOSIS — E785 Hyperlipidemia, unspecified: Secondary | ICD-10-CM

## 2024-06-12 DIAGNOSIS — J0100 Acute maxillary sinusitis, unspecified: Secondary | ICD-10-CM | POA: Diagnosis not present

## 2024-06-12 DIAGNOSIS — I10 Essential (primary) hypertension: Secondary | ICD-10-CM | POA: Diagnosis not present

## 2024-06-12 LAB — LIPID PANEL
Cholesterol: 125 mg/dL (ref 0–200)
HDL: 53.2 mg/dL (ref >39.00)
LDL Cholesterol: 50 mg/dL (ref 0–99)
NonHDL: 71.55
Total CHOL/HDL Ratio: 2
Triglycerides: 109 mg/dL (ref 0.0–149.0)
VLDL: 21.8 mg/dL (ref 0.0–40.0)

## 2024-06-12 LAB — CBC
HCT: 37.3 % (ref 36.0–46.0)
Hemoglobin: 12.2 g/dL (ref 12.0–15.0)
MCHC: 32.8 g/dL (ref 30.0–36.0)
MCV: 84.1 fl (ref 78.0–100.0)
Platelets: 245 K/uL (ref 150.0–400.0)
RBC: 4.43 Mil/uL (ref 3.87–5.11)
RDW: 13.1 % (ref 11.5–15.5)
WBC: 5.9 K/uL (ref 4.0–10.5)

## 2024-06-12 LAB — COMPREHENSIVE METABOLIC PANEL WITH GFR
ALT: 22 U/L (ref 0–35)
AST: 29 U/L (ref 0–37)
Albumin: 4.4 g/dL (ref 3.5–5.2)
Alkaline Phosphatase: 54 U/L (ref 39–117)
BUN: 41 mg/dL — ABNORMAL HIGH (ref 6–23)
CO2: 26 meq/L (ref 19–32)
Calcium: 9.9 mg/dL (ref 8.4–10.5)
Chloride: 101 meq/L (ref 96–112)
Creatinine, Ser: 1.77 mg/dL — ABNORMAL HIGH (ref 0.40–1.20)
GFR: 28.52 mL/min — ABNORMAL LOW
Glucose, Bld: 91 mg/dL (ref 70–99)
Potassium: 5.2 meq/L — ABNORMAL HIGH (ref 3.5–5.1)
Sodium: 138 meq/L (ref 135–145)
Total Bilirubin: 0.4 mg/dL (ref 0.2–1.2)
Total Protein: 7.1 g/dL (ref 6.0–8.3)

## 2024-06-12 LAB — HEMOGLOBIN A1C: Hgb A1c MFr Bld: 6.6 % — ABNORMAL HIGH (ref 4.6–6.5)

## 2024-06-12 NOTE — Progress Notes (Signed)
 Chief Complaint  Patient presents with   Medication Refill    Medication Check up    Subjective AMIYAH Stanley is a 73 y.o. female who presents for hypertension follow up. She does monitor home blood pressures. Running in the 100-120/60's on average.  She is compliant with medications- lisinopril , Toprol  XL 100 mg/d. Patient has these side effects of medication: none She is adhering to a healthy diet overall. Current exercise: seated elliptical, cycling, aerobics, walking, strength training.  No CP or SOB.   Hyperlipidemia Patient presents for dyslipidemia follow up. Currently being treated with Crestor  40 mg/d and compliance with treatment thus far has been good. She denies myalgias. Diet/exercise as above. The patient is known to have coexisting coronary artery disease.  URI Duration: 3 days  Associated symptoms: sinus headache, sinus congestion, sinus pain, and rhinorrhea Denies: ear pain, ear drainage, sore throat, wheezing, shortness of breath, myalgia, and fevers Treatment to date: Tylenol  Sick contacts: Yes- spouse   Past Medical History:  Diagnosis Date   Allergy    CAD (coronary artery disease)    GERD (gastroesophageal reflux disease)    Hyperlipidemia    Hypertension     Exam BP 122/74 (BP Location: Left Arm, Patient Position: Sitting)   Pulse 72   Temp 98 F (36.7 C) (Oral)   Resp 16   Ht 5' 2 (1.575 m)   Wt 129 lb (58.5 kg)   SpO2 95%   BMI 23.59 kg/m  General:  well developed, well nourished, in no apparent distress HEENT: ear canals patent, no otorrhea, TM's neg, MMM, no pharyngeal exudate/erythema, nares are patent without rhinorrhea, sinus ttp over bl max sinuses.  Heart: RRR, no bruits, no LE edema Lungs: clear to auscultation, no accessory muscle use Psych: well oriented with normal range of affect and appropriate judgment/insight  Essential hypertension - Plan: Comprehensive metabolic panel with GFR, CBC, Lipid panel  Hyperlipidemia,  unspecified hyperlipidemia type  Prediabetes - Plan: Hemoglobin A1c  Acute non-recurrent maxillary sinusitis  Chronic, stable. Cont Toprol  XL 100 mg, lisinopril  20 mg/d. Counseled on diet and exercise. Chronic, stable.  Continue Crestor  40 mg daily. Follow-up on A1c. Seems to be improving.  Reassurance.  Continue Tylenol  as needed.  Push fluids. Flu shot recommended for mid-October. F/u in 6 mo. The patient voiced understanding and agreement to the plan.  Mabel Mt Marquette, DO 06/12/24  1:19 PM

## 2024-06-12 NOTE — Patient Instructions (Addendum)
 Give us  2-3 business days to get the results of your labs back.   Keep the diet clean and stay active.  For symptoms, consider using Vick's VapoRub on chest or under nose, air humidifier, Benadryl at night, and elevating the head of the bed. Tylenol  and ibuprofen for aches and pains you may be experiencing.   I recommend getting the flu shot in mid October. This suggestion would change if the CDC comes out with a different recommendation.   Let us  know if you need anything.

## 2024-06-20 ENCOUNTER — Other Ambulatory Visit (INDEPENDENT_AMBULATORY_CARE_PROVIDER_SITE_OTHER)

## 2024-06-20 ENCOUNTER — Ambulatory Visit: Payer: Self-pay | Admitting: Family Medicine

## 2024-06-20 DIAGNOSIS — I10 Essential (primary) hypertension: Secondary | ICD-10-CM | POA: Diagnosis not present

## 2024-06-20 LAB — BASIC METABOLIC PANEL WITH GFR
BUN: 66 mg/dL — ABNORMAL HIGH (ref 6–23)
CO2: 25 meq/L (ref 19–32)
Calcium: 9.6 mg/dL (ref 8.4–10.5)
Chloride: 104 meq/L (ref 96–112)
Creatinine, Ser: 1.8 mg/dL — ABNORMAL HIGH (ref 0.40–1.20)
GFR: 27.95 mL/min — ABNORMAL LOW (ref >60.00)
Glucose, Bld: 95 mg/dL (ref 70–99)
Potassium: 4.7 meq/L (ref 3.5–5.1)
Sodium: 140 meq/L (ref 135–145)

## 2024-06-21 ENCOUNTER — Other Ambulatory Visit: Payer: Self-pay | Admitting: Family Medicine

## 2024-06-21 DIAGNOSIS — M7062 Trochanteric bursitis, left hip: Secondary | ICD-10-CM

## 2024-06-21 DIAGNOSIS — N184 Chronic kidney disease, stage 4 (severe): Secondary | ICD-10-CM

## 2024-07-04 ENCOUNTER — Encounter: Payer: Self-pay | Admitting: Family Medicine

## 2024-07-05 ENCOUNTER — Other Ambulatory Visit: Payer: Self-pay

## 2024-07-05 MED ORDER — PANTOPRAZOLE SODIUM 20 MG PO TBEC: 20.0000 mg | DELAYED_RELEASE_TABLET | Freq: Every day | ORAL | 3 refills | Status: AC

## 2024-07-18 ENCOUNTER — Other Ambulatory Visit: Payer: Self-pay | Admitting: Pharmacist

## 2024-07-18 MED ORDER — FARXIGA 10 MG PO TABS: 10.0000 mg | ORAL_TABLET | Freq: Every day | ORAL | 0 refills | Status: DC

## 2024-07-18 NOTE — Progress Notes (Signed)
 07/18/2024 Name: Kelsey Stanley MRN: [id number] DOB: [birth year]  Chief Complaint  Patient presents with   Medication Management    Kelsey Stanley is a 73 y.o. year old female who presented for a telephone visit.   They were referred to the pharmacist by their PCP for assistance in managing medication access.    Subjective:  Patient reports high cost of Farxiga . She has been approved thru 10/16/2024 for [place] medication assistance program / Farxiga . Today she reports that she needs an update Rx sent to [facility] fro Farxiga .   She will see nephrologist for initial visit 07/24/2024 because he last Scr had increased despite starting Farxiga  and taking lisinopril .   I noticed that she also had celecoxib  on her medication list - take 200mg  twice a day as needed. [name] reports that she has not been taking celecoxib  as much since her last labs. She takes about 1 capsule per week. She has starting taking acetaminophen  as needed instead.   Her A1c also increased to 6.6%. Since her labs she has stopped eating sweets (she was eating ice cream several day per week).  She continues to work out 5 days per week at the gym.    Her husband also takes Farxiga  and has been getting thru the [employer] (he applies thru his cardiologist's office)   Medication Access/Adherence  Current Pharmacy:  [facility] [place], [place] - [address] [place] [address] Phone: [phone] Fax: [phone]  [facility] [address] [place], [place] - [address] [place] [address] Phone: [phone] Fax: [phone]  [facility] (Now [facility]) - [address] [place], [place] - [address] [place] [address] Phone: [phone] Fax: [phone]  [facility] - [place], [place] - [address] [place]  [address] Phone: [phone] Fax: [phone]  [facility] - [place], [place] - [address] [place] [address] [place] [address] Phone: [phone] Fax: [phone]    Objective:  Lab Results  Component Value Date   HGBA1C 6.6 (H) 06/12/2024    Lab Results  Component Value Date   CREATININE 1.80 (H) 06/20/2024   BUN 66 (H) 06/20/2024   NA 140 06/20/2024   K 4.7 06/20/2024   CL 104 06/20/2024   CO2 25 06/20/2024    Lab Results  Component Value Date   CHOL 125 06/12/2024   HDL 53.20 06/12/2024   LDLCALC 50 06/12/2024   TRIG 109.0 06/12/2024   CHOLHDL 2 06/12/2024    Medications Reviewed Today     Reviewed by Carla Milling, RPH-CPP (Pharmacist) on 07/18/24 at 1421  Med List Status: <None>   Medication Order Taking? Sig Documenting Provider Last Dose Status Informant  acetaminophen  (TYLENOL ) 500 MG tablet [id number] Yes Take 500 mg by mouth every 6 (six) hours as needed. [provider]  Active   aspirin  81 MG chewable tablet [id number] Yes Chew 81 mg by mouth daily. [provider]  Active   azelastine  (ASTELIN ) 0.1 % nasal spray [id number]  Place 2 sprays into both nostrils 2 (two) times daily. Use in each nostril as directed Saguier, [place], PA-C  Active   Calcium  Carbonate-Vit D-Min (CALCIUM  1200 PO) [id number]  Take 1,200 mg by mouth daily. [provider]  Active   celecoxib  (CELEBREX ) 200 MG capsule [id number] Yes Take 1  capsule (200 mg total) by mouth 2 (two) times daily as needed. Frann Mabel Mt, DO  Active   Ergocalciferol 10 MCG (400 UNIT) TABS [id number]  Take 1,000 Units by mouth daily. [provider]  Active   FARXIGA  10 MG TABS tablet [id number]  Take 1 tablet (10 mg total) by mouth daily. Frann Mabel Mt, DO  Active   levocetirizine (XYZAL ) 5 MG tablet [id number]  TAKE 1 TABLET EVERY EVENING Wendling, Mabel Mt, DO  Active   lisinopril  (ZESTRIL ) 20 MG tablet [id number] Yes Take 1 tablet (20 mg total) by  mouth daily. Frann Mabel Mt, DO  Active   metoprolol  succinate (TOPROL -XL) 100 MG 24 hr tablet [id number] Yes Take 1 tablet (100 mg total) by mouth daily. Frann Mabel Mt, DO  Active   nitroGLYCERIN  (NITROSTAT ) 0.4 MG SL tablet [id number]  Place under the tongue. [provider]  Active   pantoprazole  (PROTONIX ) 20 MG tablet [id number] Yes Take 1 tablet (20 mg total) by mouth daily. Frann Mabel Mt, DO  Active   Probiotic Product (PROBIOTIC-10 PO) [id number]  Take 1 capsule by mouth daily.  [provider]  Active Self           Med Note BENJIE, MARTHA A   Thu Dec 03, 2020  9:03 AM)    rosuvastatin  (CRESTOR ) 40 MG tablet [id number] Yes Take 1 tablet (40 mg total) by mouth daily. Frann Mabel Mt, DO  Active               Assessment/Plan:   Medication Access:  - Will coordinate with Medication Assistance team to send out [place] and [place] Application for 2026 to patient.  - Updated Rx to [facility] for Farxiga  10mg  - #90 / 0 RF - Recommended she not take celecoxib . Take acetaminophen  500mg  as needed up to 4000mg  per day.    Follow Up Plan: 2 to 4 weeks  [name], PharmD Clinical Pharmacist [facility] Primary [facility]  Population Health [phone]

## 2024-07-19 ENCOUNTER — Telehealth: Payer: Self-pay

## 2024-07-19 NOTE — Telephone Encounter (Signed)
 PAP: Patient assistance application for Farxiga through [employer] (AZ&Me) has been mailed to pt's home address on file. Provider portion of application will be faxed to provider's office.

## 2024-07-24 DIAGNOSIS — N179 Acute kidney failure, unspecified: Secondary | ICD-10-CM | POA: Diagnosis not present

## 2024-07-24 DIAGNOSIS — I129 Hypertensive chronic kidney disease with stage 1 through stage 4 chronic kidney disease, or unspecified chronic kidney disease: Secondary | ICD-10-CM | POA: Diagnosis not present

## 2024-07-24 DIAGNOSIS — Z23 Encounter for immunization: Secondary | ICD-10-CM | POA: Diagnosis not present

## 2024-07-24 DIAGNOSIS — N1832 Chronic kidney disease, stage 3b: Secondary | ICD-10-CM | POA: Diagnosis not present

## 2024-07-31 ENCOUNTER — Telehealth: Payer: Self-pay | Admitting: Pharmacist

## 2024-07-31 MED ORDER — FARXIGA 10 MG PO TABS: 10.0000 mg | ORAL_TABLET | Freq: Every day | ORAL | 0 refills | Status: AC

## 2024-07-31 NOTE — Telephone Encounter (Signed)
 Received fax from [facility] and Me that they needed an updated Rx for [facility] for patient assistance program. Updated Rx was sent to [facility] which is the pharamcy that fills for the [place] and [place] patient assistance program.  Spoke with a representative. Rx from 07/18/2024 was processed and sent to patient but their system will continue to request an updated Rx since no refills are left on the prescription they currently have.  Sent in updated Rx for patient's next refill which won't be due until January 2026.

## 2024-08-01 ENCOUNTER — Other Ambulatory Visit: Payer: Self-pay | Admitting: Pharmacist

## 2024-08-01 NOTE — Progress Notes (Signed)
 08/01/2024 Name: Kelsey Stanley MRN: [id number] DOB: 1951/09/03  Chief Complaint  Patient presents with   Medication Management    Kelsey Stanley is a 73 y.o. year old female who presented for a telephone visit.   They were referred to the pharmacist by their PCP for assistance in managing medication access.    Subjective:  Patient reports high cost of Farxiga . She has been approved thru 10/16/2024 for [place] and [place] medication assistance program / Farxiga . Today she reports she received a text that Farxiga  was shipped but then received a text that a new Rx was needed. She has about 2 weeks of Farxiga  on hand currently.   She saw Dr Marlee at [facility] on 07/24/2024 for an initial evaluation for elevated serum creatinine. Reviewed his notes and labs. Serum creatinine on 07/24/2024 was better at 1.42 and eGFR was 9ml/min uACR was WNL at 18  Kelsey Stanley reports today that Dr Marlee also instructed her to try to avoid taking celebrex . She can have once per month for hip pain.   Her A1c also increased to 6.6%. Since her labs she has stopped eating sweets (she was eating ice cream several day per week). She has also cut out [place] team from [place] at Dr Johnathan recommendations She continues to work out 5 days per week at [employer]    Medication Access/Adherence  Current Pharmacy:  [facility] - [place], [place] - [address] [place] [address] Phone: [phone] Fax: [phone]  [facility] [address] [place], [place] - [address] [place] [address] Phone: [phone] Fax: [phone]  [facility] (Now [facility]) - [address] [place], [place] - [address] [place] [address] Phone: [phone] Fax: [phone]  [facility] - [place], [place] - [address] [place] [address] Phone:  [phone] Fax: [phone]  [facility] - [place], [place] - [address] [place] [address] [place] [address] Phone: [phone] Fax: [phone]    Objective:  Lab Results  Component Value Date   HGBA1C 6.6 (H) 06/12/2024    Lab Results  Component Value Date   CREATININE 1.80 (H) 06/20/2024   BUN 66 (H) 06/20/2024   NA 140 06/20/2024   K 4.7 06/20/2024   CL 104 06/20/2024   CO2 25 06/20/2024    Lab Results  Component Value Date   CHOL 125 06/12/2024   HDL 53.20 06/12/2024   LDLCALC 50 06/12/2024   TRIG 109.0 06/12/2024   CHOLHDL 2 06/12/2024    Medications Reviewed Today     Reviewed by Carla Milling, RPH-CPP (Pharmacist) on 08/01/24 at 1110  Med List Status: <None>   Medication Order Taking? Sig Documenting Provider Last Dose Status Informant  acetaminophen  (TYLENOL ) 500 MG tablet [id number] Yes Take 500 mg by mouth every 6 (six) hours as needed. [provider]  Active   aspirin  81 MG chewable tablet [id number] Yes Chew 81 mg by mouth daily. [provider]  Active   azelastine  (ASTELIN ) 0.1 % nasal spray [id number] Yes Place 2 sprays into both nostrils 2 (two) times daily. Use in each nostril as directed Saguier, [place], PA-C  Active   Calcium  Carbonate-Vit D-Min (CALCIUM  1200 PO) [id number] Yes Take 1,200 mg by mouth daily. [provider]  Active   celecoxib  (CELEBREX ) 200 MG capsule [id number]  Take 1 capsule (200 mg total) by mouth 2 (two) times daily as needed.  Patient not taking: Reported on 08/01/2024   Frann Mabel Mt, DO  Active   Ergocalciferol 10 MCG (400 UNIT) TABS [id number]  Take 1,000 Units by mouth daily. [provider]  Active   FARXIGA  10 MG TABS tablet [id number] Yes Take 1 tablet (10 mg total) by mouth daily. Frann Mabel Mt, DO  Active   levocetirizine (XYZAL ) 5 MG tablet [id number]  TAKE 1 TABLET EVERY EVENING Wendling, Mabel Mt, DO  Active   lisinopril  (ZESTRIL ) 20 MG tablet  [id number] Yes Take 1 tablet (20 mg total) by mouth daily. Frann Mabel Mt, DO  Active   metoprolol  succinate (TOPROL -XL) 100 MG 24 hr tablet [id number] Yes Take 1 tablet (100 mg total) by mouth daily. Frann Mabel Mt, DO  Active   nitroGLYCERIN  (NITROSTAT ) 0.4 MG SL tablet [id number]  Place under the tongue. [provider]  Active   pantoprazole  (PROTONIX ) 20 MG tablet [id number]  Take 1 tablet (20 mg total) by mouth daily. Frann Mabel Mt, DO  Active   Probiotic Product (PROBIOTIC-10 PO) [id number]  Take 1 capsule by mouth daily.  [provider]  Active Self           Med Note BENJIE, MARTHA A   Thu Dec 03, 2020  9:03 AM)    rosuvastatin  (CRESTOR ) 40 MG tablet [id number] Yes Take 1 tablet (40 mg total) by mouth daily. Frann Mabel Mt, DO  Active               Assessment/Plan:   Medication Access:  - Continue to work with Medication Assistance Team to re-enroll in [place] and [place] program for Farxiga  for 2026. Patient has completed her portion and will return to Med Assist Team by mail.   - Patient to notify me if she  has not received Farxiga  in the mail by next week. It was shipped yesterday 07/31/2024.  - Recommended she not take celecoxib . Take acetaminophen  500mg  as needed up to 4000mg  per day.   CKD / hypertension / Diabetes:  - continue Farxiga  10mg  daily  - Recommended she continue to check blood pressure daily at home - goal is < 130/80. Home BPs have been at goal but office blood pressure is often above goal - Recommended patient bring in her home meter to next appointment to check against office blood pressure.  - Continue to follow up with Dr Marlee and Dr Frann.    Follow Up Plan: 4 weeks  [name], PharmD Clinical Pharmacist [facility] Primary Care  Population Health [phone]

## 2024-08-05 ENCOUNTER — Other Ambulatory Visit (HOSPITAL_COMMUNITY): Payer: Self-pay

## 2024-08-19 NOTE — Telephone Encounter (Signed)
 Provider portion emailed to me 08/12/2024 - printed and forwarded to PCP. Received back form PCP today and faxed to Luke Mall at [phone].

## 2024-08-20 NOTE — Telephone Encounter (Signed)
 PAP: Application for Marcelline Deist has been submitted to [employer] (AZ&Me), via fax

## 2024-08-21 ENCOUNTER — Other Ambulatory Visit (HOSPITAL_COMMUNITY): Payer: Self-pay

## 2024-08-21 NOTE — Telephone Encounter (Signed)
 PAP: Patient assistance application for Farxiga  has been approved by PAP Companies: AZ&ME from 10/17/2024 to 10/16/2025. Medication should be delivered to PAP Delivery: Home. For further shipping updates, please contact [employer] (AZ&Me) at [phone]. Patient ID is: [id number]

## 2024-09-04 ENCOUNTER — Other Ambulatory Visit: Payer: Self-pay | Admitting: Family Medicine

## 2024-09-04 DIAGNOSIS — J310 Chronic rhinitis: Secondary | ICD-10-CM

## 2024-09-11 ENCOUNTER — Other Ambulatory Visit: Payer: Self-pay | Admitting: Family Medicine

## 2024-09-11 DIAGNOSIS — I10 Essential (primary) hypertension: Secondary | ICD-10-CM

## 2024-09-16 ENCOUNTER — Other Ambulatory Visit (INDEPENDENT_AMBULATORY_CARE_PROVIDER_SITE_OTHER)

## 2024-09-16 DIAGNOSIS — R7303 Prediabetes: Secondary | ICD-10-CM

## 2024-09-16 LAB — HEMOGLOBIN A1C: Hgb A1c MFr Bld: 6 % (ref 4.6–6.5)

## 2024-09-17 ENCOUNTER — Other Ambulatory Visit: Payer: Self-pay | Admitting: Pharmacist

## 2024-09-17 NOTE — Progress Notes (Signed)
 09/17/2024 Name: Kelsey Stanley MRN: [id number] DOB: 03-Nov-1950  Chief Complaint  Patient presents with   Medication Management   Diabetes    Kelsey Stanley is a 73 y.o. year old female who presented for a telephone visit.   They were referred to the pharmacist by their PCP for assistance in managing medication access.    Subjective:  Patient reports high cost of Farxiga . She has been approved thru 10/16/2025 for [place] / Farxiga . Today she reports she received a text that Farxiga  was shipped but then received a text that a new Rx was needed. She has about 2 weeks of Farxiga  on hand currently.   She saw Dr Marlee at [facility] on 07/24/2024 for an initial evaluation for elevated serum creatinine. Reviewed his notes and labs. Serum creatinine on 07/24/2024 was better at 1.42 and eGFR was 8ml/min uACR was WNL at 18  Kelsey Stanley reports today she has only taken celebrex . She can have once per month for hip pain.   Her A1c was checked yesterday and had decreased to 6.0% (previously was 6.6%).  She has continued to not eat sweets and has eliminated juices and sweet coffee drinks.    She continues to work out 5 days per week at the gym.    Medication Access/Adherence  Current Pharmacy:  [facility] - [place], [place] - [address] [place] [address] Phone: [phone] Fax: [phone]  [facility] [address] [place], [place] - [address] [place] [address] Phone: [phone] Fax: [phone]  [facility] - [place], [place] - [address] [place] [address] Phone: [phone] Fax: [phone]  [facility] - [place], [place] - [address] [place] [address] Phone: [phone] Fax: [phone]    Objective:  Lab Results  Component Value Date   HGBA1C 6.0 09/16/2024    Lab  Results  Component Value Date   CREATININE 1.80 (H) 06/20/2024   BUN 66 (H) 06/20/2024   NA 140 06/20/2024   K 4.7 06/20/2024   CL 104 06/20/2024   CO2 25 06/20/2024    Lab Results  Component Value Date   CHOL 125 06/12/2024   HDL 53.20 06/12/2024   LDLCALC 50 06/12/2024   TRIG 109.0 06/12/2024   CHOLHDL 2 06/12/2024    Medications Reviewed Today     Reviewed by Carla Milling, RPH-CPP (Pharmacist) on 09/17/24 at 1114  Med List Status: <None>   Medication Order Taking? Sig Documenting Provider Last Dose Status Informant  acetaminophen  (TYLENOL ) 500 MG tablet [id number]  Take 500 mg by mouth every 6 (six) hours as needed. [provider]  Active   aspirin  81 MG chewable tablet [id number]  Chew 81 mg by mouth daily. [provider]  Active   azelastine  (ASTELIN ) 0.1 % nasal spray [id number]  Place 2 sprays into both nostrils 2 (two) times daily. Use in each nostril as directed Saguier, [place], PA-C  Active   Calcium  Carbonate-Vit D-Min (CALCIUM  1200 PO) [id number] Yes Take 1,200 mg by mouth daily.  Patient taking differently: Take 1,200 mg by mouth daily. Calcium  + 1000 units of vitamin D   [provider]  Active   celecoxib  (CELEBREX ) 200 MG capsule [id number]  Take 1 capsule (200 mg total) by mouth 2 (two) times daily as needed.  Patient not taking: Reported  on 09/17/2024   Frann Mabel Mt, DO  Active     Discontinued 09/17/24 1054 (Change in therapy)   FARXIGA  10 MG TABS tablet [id number]  Take 1 tablet (10 mg total) by mouth daily. Frann Mabel Mt, DO  Active   levocetirizine (XYZAL ) 5 MG tablet [id number]  TAKE 1 TABLET EVERY EVENING Wendling, Mabel Mt, DO  Active   lisinopril  (ZESTRIL ) 20 MG tablet [id number]  TAKE 1 TABLET EVERY DAY Frann Mabel Mt, DO  Active   metoprolol  succinate (TOPROL -XL) 100 MG 24 hr tablet [id number]  Take 1 tablet (100 mg total) by mouth daily. Frann Mabel Mt, DO  Active   nitroGLYCERIN   (NITROSTAT ) 0.4 MG SL tablet [id number]  Place under the tongue. [provider]  Active   pantoprazole  (PROTONIX ) 20 MG tablet [id number]  Take 1 tablet (20 mg total) by mouth daily. Frann Mabel Mt, DO  Active   Probiotic Product (PROBIOTIC-10 PO) [id number]  Take 1 capsule by mouth daily.  [provider]  Active Self           Med Note BENJIE, MARTHA A   Thu Dec 03, 2020  9:03 AM)    rosuvastatin  (CRESTOR ) 40 MG tablet [id number]  Take 1 tablet (40 mg total) by mouth daily. Frann Mabel Mt, DO  Active               Assessment/Plan:   Medication Access:  - Patient was been approved to received Farxiga  thru 10/16/2025.   CKD / hypertension / Diabetes:  - continue Farxiga  10mg  daily  - Continue to follow limit sugar in diet and exercise regularly - patient is getting at least 150 minutes per week. - Continue to follow up with Dr Marlee and Dr Frann.  - Continue acetaminophen  500mg  as needed up to 4000mg  per day.   Health Maintenance - Discussed annual influenza vaccine - patient has received at last appointment with Dr Marlee 10/08/02025 - notes are scanned to her chart under media. Forwarded message to Luke [employer] to abstract annual influenza vaccine administration   Follow Up Plan: 3 months.   [name], PharmD Clinical Pharmacist [facility]  Population Health [phone]

## 2024-10-23 ENCOUNTER — Telehealth: Payer: Self-pay | Admitting: Pharmacist

## 2024-10-23 NOTE — Telephone Encounter (Signed)
 Patient's Farxiga  10mg  from [place] program was mailed to our office - Farxiga  10mg  #90 tablets received.  Place at front desk for patient to pick up at her convenience. LM on her VM that med was at front desk.

## 2024-12-11 ENCOUNTER — Ambulatory Visit: Admitting: Family Medicine

## 2024-12-17 ENCOUNTER — Other Ambulatory Visit: Admitting: Pharmacist

## 2025-02-27 ENCOUNTER — Ambulatory Visit
